# Patient Record
Sex: Female | Born: 1945 | Smoking: Never smoker
Health system: Southern US, Community
[De-identification: ages and names within clinical notes are randomized; demographics above are authoritative.]

## PROBLEM LIST (undated history)

## (undated) DIAGNOSIS — R011 Cardiac murmur, unspecified: Secondary | ICD-10-CM

## (undated) DIAGNOSIS — I10 Essential (primary) hypertension: Secondary | ICD-10-CM

## (undated) DIAGNOSIS — R600 Localized edema: Secondary | ICD-10-CM

## (undated) DIAGNOSIS — N390 Urinary tract infection, site not specified: Secondary | ICD-10-CM

## (undated) DIAGNOSIS — M1990 Unspecified osteoarthritis, unspecified site: Secondary | ICD-10-CM

## (undated) DIAGNOSIS — N189 Chronic kidney disease, unspecified: Secondary | ICD-10-CM

## (undated) DIAGNOSIS — R609 Edema, unspecified: Secondary | ICD-10-CM

## (undated) DIAGNOSIS — E119 Type 2 diabetes mellitus without complications: Secondary | ICD-10-CM

## (undated) HISTORY — PX: TONSILLECTOMY: SUR1361

## (undated) HISTORY — PX: HERNIA REPAIR: SHX51

## (undated) HISTORY — PX: TUBAL LIGATION: SHX77

---

## 1981-03-30 HISTORY — PX: BUNIONECTOMY: SHX129

## 1984-03-30 HISTORY — PX: BREAST SURGERY: SHX581

## 1989-03-30 HISTORY — PX: APPENDECTOMY: SHX54

## 1989-03-30 HISTORY — PX: ABDOMINAL HYSTERECTOMY: SHX81

## 1990-03-30 HISTORY — PX: CHOLECYSTECTOMY: SHX55

## 2007-03-31 HISTORY — PX: GASTRIC BYPASS: SHX52

## 2016-03-30 HISTORY — PX: PANNICULECTOMY: SUR1001

## 2018-03-30 DIAGNOSIS — D649 Anemia, unspecified: Secondary | ICD-10-CM

## 2018-03-30 HISTORY — DX: Anemia, unspecified: D64.9

## 2020-07-08 DIAGNOSIS — M1712 Unilateral primary osteoarthritis, left knee: Secondary | ICD-10-CM | POA: Diagnosis present

## 2020-07-10 ENCOUNTER — Encounter (HOSPITAL_COMMUNITY): Payer: Self-pay

## 2020-07-10 NOTE — Patient Instructions (Addendum)
DUE TO COVID-19 ONLY ONE VISITOR IS ALLOWED TO COME WITH YOU AND STAY IN THE WAITING ROOM ONLY DURING PRE OP AND PROCEDURE DAY OF SURGERY. THE 1 VISITOR  MAY VISIT WITH YOU AFTER SURGERY IN YOUR PRIVATE ROOM DURING VISITING HOURS ONLY!  YOU NEED TO HAVE A COVID 19 TEST ON___4/22____ @_2 :05 pm_____, THIS TEST MUST BE DONE BEFORE SURGERY,  COVID TESTING SITE [address] [place] , IT IS ON THE RIGHT GOING OUT [address] APPROXIMATELY  2 MINUTES PAST [employer] ON THE RIGHT. ONCE YOUR COVID TEST IS COMPLETED,  PLEASE BEGIN THE QUARANTINE INSTRUCTIONS AS OUTLINED IN YOUR HANDOUT.                Lori Navarro   Your procedure is scheduled on: 07/23/20   Report to [facility] Main  Entrance   Report to short stay at  5:15 AM     Call this number if you have problems the morning of surgery [phone]     BRUSH YOUR TEETH MORNING OF SURGERY AND RINSE YOUR MOUTH OUT, NO CHEWING GUM CANDY OR MINTS.  No food after midnight.    You may have clear liquid until 4:30 AM.     Nothing by mouth after 4:30 AM.    Take these medicines the morning of surgery with A SIP OF WATER: none  DO NOT TAKE ANY DIABETIC MEDICATIONS DAY OF YOUR SURGERY                               You may not have any metal on your body including hair pins and              piercings  Do not wear jewelry, make-up, lotions, powders or perfumes, deodorant             Do not wear nail polish on your fingernails.  Do not shave  48 hours prior to surgery.     Do not bring valuables to the hospital. [facility] IS NOT             RESPONSIBLE   FOR VALUABLES.  Contacts, dentures or bridgework may not be worn into surgery. .                  Please read over the following fact sheets you were given: _____________________________________________________________________             [facility] - Preparing for Surgery Before surgery, you can play an important role.  Because skin is  not sterile, your skin needs to be as free of germs as possible.  You can reduce the number of germs on your skin by washing with CHG (chlorahexidine gluconate) soap before surgery.  CHG is an antiseptic cleaner which kills germs and bonds with the skin to continue killing germs even after washing. Please DO NOT use if you have an allergy to CHG or antibacterial soaps.  If your skin becomes reddened/irritated stop using the CHG and inform your nurse when you arrive at Short Stay. Do not shave (including legs and underarms) for at least 48 hours prior to the first CHG shower.   Please follow these instructions carefully:  1.  Shower with CHG Soap the night before surgery and the  morning of Surgery.  2.  If you choose to wash your hair, wash your hair first as usual with your  normal  shampoo.  3.  After you shampoo, rinse your hair and body thoroughly to remove the  shampoo.                                        4.  Use CHG as you would any other liquid soap.  You can apply chg directly  to the skin and wash                       Gently with a scrungie or clean washcloth.  5.  Apply the CHG Soap to your body ONLY FROM THE NECK DOWN.   Do not use on face/ open                           Wound or open sores. Avoid contact with eyes, ears mouth and genitals (private parts).                       Wash face,  Genitals (private parts) with your normal soap.             6.  Wash thoroughly, paying special attention to the area where your surgery  will be performed.  7.  Thoroughly rinse your body with warm water from the neck down.  8.  DO NOT shower/wash with your normal soap after using and rinsing off  the CHG Soap.             9.  Pat yourself dry with a clean towel.            10.  Wear clean pajamas.            11.  Place clean sheets on your bed the night of your first shower and do not  sleep with pets. Day of Surgery : Do not apply any lotions/deodorants the morning of surgery.  Please wear  clean clothes to the hospital/surgery center.  FAILURE TO FOLLOW THESE INSTRUCTIONS MAY RESULT IN THE CANCELLATION OF YOUR SURGERY PATIENT SIGNATURE_________________________________  NURSE SIGNATURE__________________________________  ________________________________________________________________________   Lori Navarro  An incentive spirometer is a tool that can help keep your lungs clear and active. This tool measures how well you are filling your lungs with each breath. Taking long deep breaths may help reverse or decrease the chance of developing breathing (pulmonary) problems (especially infection) following:  A long period of time when you are unable to move or be active. BEFORE THE PROCEDURE   If the spirometer includes an indicator to show your best effort, your nurse or respiratory therapist will set it to a desired goal.  If possible, sit up straight or lean slightly forward. Try not to slouch.  Hold the incentive spirometer in an upright position. INSTRUCTIONS FOR USE  1. Sit on the edge of your bed if possible, or sit up as far as you can in bed or on a chair. 2. Hold the incentive spirometer in an upright position. 3. Breathe out normally. 4. Place the mouthpiece in your mouth and seal your lips tightly around it. 5. Breathe in slowly and as deeply as possible, raising the piston or the ball toward the top of the column. 6. Hold your breath for 3-5 seconds or for as long as possible. Allow the piston or ball to fall to the bottom of the column. 7. Remove the  mouthpiece from your mouth and breathe out normally. 8. Rest for a few seconds and repeat Steps 1 through 7 at least 10 times every 1-2 hours when you are awake. Take your time and take a few normal breaths between deep breaths. 9. The spirometer may include an indicator to show your best effort. Use the indicator as a goal to work toward during each repetition. 10. After each set of 10 deep breaths, practice  coughing to be sure your lungs are clear. If you have an incision (the cut made at the time of surgery), support your incision when coughing by placing a pillow or rolled up towels firmly against it. Once you are able to get out of bed, walk around indoors and cough well. You may stop using the incentive spirometer when instructed by your caregiver.  RISKS AND COMPLICATIONS  Take your time so you do not get dizzy or light-headed.  If you are in pain, you may need to take or ask for pain medication before doing incentive spirometry. It is harder to take a deep breath if you are having pain. AFTER USE  Rest and breathe slowly and easily.  It can be helpful to keep track of a log of your progress. Your caregiver can provide you with a simple table to help with this. If you are using the spirometer at home, follow these instructions: SEEK MEDICAL CARE IF:   You are having difficultly using the spirometer.  You have trouble using the spirometer as often as instructed.  Your pain medication is not giving enough relief while using the spirometer.  You develop fever of 100.5 F (38.1 C) or higher. SEEK IMMEDIATE MEDICAL CARE IF:   You cough up bloody sputum that had not been present before.  You develop fever of 102 F (38.9 C) or greater.  You develop worsening pain at or near the incision site. MAKE SURE YOU:   Understand these instructions.  Will watch your condition.  Will get help right away if you are not doing well or get worse. Document Released: 07/27/2006 Document Revised: 06/08/2011 Document Reviewed: 09/27/2006 [facility] Patient Information 2014 [place], [place].   ________________________________________________________________________

## 2020-07-11 ENCOUNTER — Encounter (HOSPITAL_COMMUNITY): Payer: Self-pay

## 2020-07-11 ENCOUNTER — Encounter (HOSPITAL_COMMUNITY)
Admission: RE | Admit: 2020-07-11 | Discharge: 2020-07-11 | Disposition: A | Discharge status: Home / Self Care | Payer: Medicare Other | Source: Ambulatory Visit | Attending: Orthopedic Surgery | Admitting: Orthopedic Surgery

## 2020-07-11 ENCOUNTER — Other Ambulatory Visit: Payer: Self-pay

## 2020-07-11 DIAGNOSIS — Z01818 Encounter for other preprocedural examination: Secondary | ICD-10-CM | POA: Insufficient documentation

## 2020-07-11 HISTORY — DX: Type 2 diabetes mellitus without complications: E11.9

## 2020-07-11 HISTORY — DX: Urinary tract infection, site not specified: N39.0

## 2020-07-11 HISTORY — DX: Essential (primary) hypertension: I10

## 2020-07-11 HISTORY — DX: Chronic kidney disease, unspecified: N18.9

## 2020-07-11 HISTORY — DX: Unspecified osteoarthritis, unspecified site: M19.90

## 2020-07-11 HISTORY — DX: Cardiac murmur, unspecified: R01.1

## 2020-07-11 HISTORY — DX: Edema, unspecified: R60.9

## 2020-07-11 HISTORY — DX: Localized edema: R60.0

## 2020-07-11 LAB — CBC
HCT: 39 % (ref 36.0–46.0)
Hemoglobin: 12.7 g/dL (ref 12.0–15.0)
MCH: 31.1 pg (ref 26.0–34.0)
MCHC: 32.6 g/dL (ref 30.0–36.0)
MCV: 95.4 fL (ref 80.0–100.0)
Platelets: 224 10*3/uL (ref 150–400)
RBC: 4.09 MIL/uL (ref 3.87–5.11)
RDW: 13.2 % (ref 11.5–15.5)
WBC: 5.1 10*3/uL (ref 4.0–10.5)
nRBC: 0 % (ref 0.0–0.2)

## 2020-07-11 LAB — SURGICAL PCR SCREEN
MRSA, PCR: NEGATIVE
Staphylococcus aureus: NEGATIVE

## 2020-07-11 LAB — BASIC METABOLIC PANEL
Anion gap: 10 (ref 5–15)
BUN: 14 mg/dL (ref 8–23)
CO2: 27 mmol/L (ref 22–32)
Calcium: 9.2 mg/dL (ref 8.9–10.3)
Chloride: 102 mmol/L (ref 98–111)
Creatinine, Ser: 0.39 mg/dL — ABNORMAL LOW (ref 0.44–1.00)
GFR, Estimated: >60 mL/min (ref >60)
Glucose, Bld: 120 mg/dL — ABNORMAL HIGH (ref 70–99)
Potassium: 3.5 mmol/L (ref 3.5–5.1)
Sodium: 139 mmol/L (ref 135–145)

## 2020-07-11 LAB — HEMOGLOBIN A1C
Hgb A1c MFr Bld: 5.6 % (ref 4.8–5.6)
Mean Plasma Glucose: 114.02 mg/dL

## 2020-07-11 LAB — GLUCOSE, CAPILLARY: Glucose-Capillary: 92 mg/dL (ref 70–99)

## 2020-07-11 NOTE — Progress Notes (Signed)
COVID Vaccine Completed:No Date COVID Vaccine completed: COVID vaccine manufacturer: Pfizer    [facility] [employer]   PCP - Dr. Juliene Pina Cardiologist -referred by Dr. Carolin Sicks. To a Dr with Lucienne Minks  on [address] for ECHO.   Chest x-ray - no EKG - 07/11/20-chart,epic Stress Test - no ECHO -  Cardiac Cath - no Pacemaker/ICD device last checked:NA  Sleep Study no-  CPAP -   Fasting Blood Sugar - Pt doesn't test. She takes Metformin daily but considers herself "pre diabetic" Checks Blood Sugar _____ times a day  Blood Thinner Instructions:NA Aspirin Instructions: Last Dose:  Anesthesia review:   Patient denies shortness of breath, fever, cough and chest pain at PAT appointment  yes Patient verbalized understanding of instructions that were given to them at the PAT appointment. Patient was also instructed that they will need to review over the PAT instructions again at home before surgery.Yes  Pt reports no SOB with any activities.

## 2020-07-18 ENCOUNTER — Encounter (HOSPITAL_COMMUNITY): Payer: Self-pay

## 2020-07-18 NOTE — Progress Notes (Signed)
Anesthesia Chart Review:   Case: [id number] Date/Time: 07/23/20 0715   Procedure: TOTAL KNEE ARTHROPLASTY (Left Knee)   Anesthesia type: Choice   Pre-op diagnosis: degenerative joint disease  left knee   Location: [facility] ROOM 07 / [facility]   Surgeons: Teryl Lucy, MD      DISCUSSION: Pt is 75 years old with hx HTN, DM, heart murmur (mild mitral regurg on 01/2019 echo), anemia   VS: BP 140/80   Pulse 78   Temp 36.7 C (Oral)   Resp 20   Ht 5\' 3"  (1.6 m)   Wt 97.5 kg   SpO2 97%   BMI 38.09 kg/m    PROVIDERS: - PCP is , PA. Last office visit 02/27/20   LABS: Labs reviewed: Acceptable for surgery. (all labs ordered are listed, but only abnormal results are displayed)  Labs Reviewed  BASIC METABOLIC PANEL - Abnormal; Notable for the following components:      Result Value   Glucose, Bld 120 (*)    Creatinine, Ser 0.39 (*)    All other components within normal limits  SURGICAL PCR SCREEN  CBC  HEMOGLOBIN A1C  GLUCOSE, CAPILLARY    EKG 07/11/20: NSR   CV: Echo 02/09/19 (done for murmur): 1. LV normal in size with mildly increased wall thickness 2. Normal LV systolic function, EF >55% 3. Normal RV size and systolic function 4. Dilated LA - mildly dilated 5. Mild mitral regurgitation 6. Mild tricuspid regurgitation 7. Mild pulmonic regurgitation   Past Medical History:  Diagnosis Date  . Anemia 2020   took iron  . Arthritis    knee  . Chronic UTI   . Diabetes mellitus without complication (HCC)    pt reports pre but takes metformin  . Heart murmur   . Hypertension   . Peripheral edema    mild    Past Surgical History:  Procedure Laterality Date  . ABDOMINAL HYSTERECTOMY  1991  . APPENDECTOMY  1991  . BREAST SURGERY  1986   reduction  . BUNIONECTOMY Bilateral 1983  . CHOLECYSTECTOMY  1992  . GASTRIC BYPASS  2009  . HERNIA REPAIR    . PANNICULECTOMY  2018  . TONSILLECTOMY     as child  . TUBAL LIGATION      MEDICATIONS: .  Carboxymethylcellulose Sodium (THERATEARS) 0.25 % SOLN  . Cholecalciferol (VITAMIN D3) 250 MCG (10000 UT) TABS  . Cholecalciferol (VITAMIN D3) 75 MCG (3000 UT) TABS  . diphenhydrAMINE (BENADRYL) 25 MG tablet  . loratadine (CLARITIN) 10 MG tablet  . losartan-hydrochlorothiazide (HYZAAR) 100-25 MG tablet  . metFORMIN (GLUCOPHAGE) 500 MG tablet  . Nutritional Supplements (DHEA PO)  . sulfamethoxazole-trimethoprim (BACTRIM) 400-80 MG tablet   No current facility-administered medications for this encounter.    If no changes, I anticipate pt can proceed with surgery as scheduled.   2019, PhD, FNP-BC [facility] Short Stay Surgical Center/Anesthesiology Phone: [phone] 07/18/2020 9:57 AM

## 2020-07-18 NOTE — Anesthesia Preprocedure Evaluation (Addendum)
Anesthesia Evaluation  Patient identified by MRN, date of birth, ID band Patient awake    Reviewed: Allergy & Precautions, NPO status , Patient's Chart, lab work & pertinent test results  Airway Mallampati: IV  TM Distance: >3 FB Neck ROM: Full    Dental no notable dental hx.    Pulmonary neg pulmonary ROS,    Pulmonary exam normal breath sounds clear to auscultation       Cardiovascular hypertension, Pt. on medications Normal cardiovascular exam Rhythm:Regular Rate:Normal  ECG: NSR, rate 69   Neuro/Psych negative neurological ROS  negative psych ROS   GI/Hepatic negative GI ROS, Neg liver ROS,   Endo/Other  diabetes, Oral Hypoglycemic Agents  Renal/GU negative Renal ROS     Musculoskeletal  (+) Arthritis ,   Abdominal   Peds  Hematology negative hematology ROS (+)   Anesthesia Other Findings degenerative joint disease left knee  Reproductive/Obstetrics                            Anesthesia Physical Anesthesia Plan  ASA: III  Anesthesia Plan: Spinal and Regional   Post-op Pain Management:  Regional for Post-op pain   Induction:   PONV Risk Score and Plan: 2 and Ondansetron, Dexamethasone, Propofol infusion and Treatment may vary due to age or medical condition  Airway Management Planned: Simple Face Mask  Additional Equipment:   Intra-op Plan:   Post-operative Plan:   Informed Consent: I have reviewed the patients History and Physical, chart, labs and discussed the procedure including the risks, benefits and alternatives for the proposed anesthesia with the patient or authorized representative who has indicated his/her understanding and acceptance.     Dental advisory given  Plan Discussed with: CRNA  Anesthesia Plan Comments: (Reviewed  APP note by Joslyn Hy, FNP )       Anesthesia Quick Evaluation

## 2020-07-19 ENCOUNTER — Other Ambulatory Visit (HOSPITAL_COMMUNITY)
Admission: RE | Admit: 2020-07-19 | Discharge: 2020-07-19 | Disposition: A | Discharge status: Home / Self Care | Payer: Medicare Other | Source: Ambulatory Visit | Attending: Orthopedic Surgery | Admitting: Orthopedic Surgery

## 2020-07-19 DIAGNOSIS — Z20822 Contact with and (suspected) exposure to covid-19: Secondary | ICD-10-CM | POA: Insufficient documentation

## 2020-07-19 DIAGNOSIS — Z01812 Encounter for preprocedural laboratory examination: Secondary | ICD-10-CM | POA: Diagnosis present

## 2020-07-20 LAB — SARS CORONAVIRUS 2 (TAT 6-24 HRS): SARS Coronavirus 2: NEGATIVE

## 2020-07-22 NOTE — H&P (Addendum)
KNEE ARTHROPLASTY ADMISSION H&P  Patient ID: Lori Navarro MRN: [id number] DOB/AGE: Jul 13, 1945 75 y.o.  Chief Complaint: left knee pain.  Planned Procedure Date: 07/23/20 Medical Clearance by Juliene Pina, PA-C     HPI: Lori Navarro is a 75 y.o. female who presents for evaluation of degenerative joint disease  left knee. The patient has a history of pain and functional disability in the left knee due to arthritis and has failed non-surgical conservative treatments for greater than 12 weeks to include NSAID's and/or analgesics, weight reduction as appropriate and activity modification.  Onset of symptoms was gradual, starting 2 years ago with rapidlly worsening course since that time. The patient noted no past surgery on the left knee.  Patient currently rates pain at 4 out of 10 with activity. Patient has worsening of pain with activity and weight bearing and pain that interferes with activities of daily living.  Patient has evidence of joint space narrowing by imaging studies.  There is no active infection.  Past Medical History:  Diagnosis Date  . Anemia 2020   took iron  . Arthritis    knee  . Chronic UTI   . Diabetes mellitus without complication (HCC)    pt reports pre but takes metformin  . Heart murmur   . Hypertension   . Peripheral edema    mild   Past Surgical History:  Procedure Laterality Date  . ABDOMINAL HYSTERECTOMY  1991  . APPENDECTOMY  1991  . BREAST SURGERY  1986   reduction  . BUNIONECTOMY Bilateral 1983  . CHOLECYSTECTOMY  1992  . GASTRIC BYPASS  2009  . HERNIA REPAIR    . PANNICULECTOMY  2018  . TONSILLECTOMY     as child  . TUBAL LIGATION     Allergies  Allergen Reactions  . Erythromycin     Dry Heaves  . Keflex [Cephalexin]     Dry heaves  . Oxycodone Hcl     Hallucinations   Prior to Admission medications   Medication Sig Start Date End Date Taking? Authorizing Provider  Carboxymethylcellulose Sodium (THERATEARS) 0.25 % SOLN Place 1 drop  into both ears daily as needed (Dry eye).   Yes [provider]  Cholecalciferol (VITAMIN D3) 250 MCG (10000 UT) TABS Take 10,000 mcg by mouth daily.   Yes [provider]  diphenhydrAMINE (BENADRYL) 25 MG tablet Take 25 mg by mouth every 6 (six) hours as needed for allergies.   Yes [provider]  loratadine (CLARITIN) 10 MG tablet Take 10 mg by mouth daily as needed for allergies.   Yes [provider]  losartan-hydrochlorothiazide (HYZAAR) 100-25 MG tablet Take 1 tablet by mouth daily.   Yes [provider]  metFORMIN (GLUCOPHAGE) 500 MG tablet Take 500 mg by mouth 2 (two) times daily with a meal.   Yes [provider]  Nutritional Supplements (DHEA PO) Take 1 tablet by mouth daily.   Yes [provider]  sulfamethoxazole-trimethoprim (BACTRIM) 400-80 MG tablet Take 1 tablet by mouth daily.   Yes [provider]  Cholecalciferol (VITAMIN D3) 75 MCG (3000 UT) TABS Take 3,000 Units by mouth daily.    [provider]   Social History   Socioeconomic History  . Marital status: Unknown    Spouse name: Not on file  . Number of children: Not on file  . Years of education: Not on file  . Highest education level: Not on file  Occupational History  . Not on file  Tobacco Use  .  Smoking status: Never Smoker  . Smokeless tobacco: Never Used  Vaping Use  . Vaping Use: Never used  Substance and Sexual Activity  . Alcohol use: Yes    Comment: one every 2 weeks  . Drug use: Never  . Sexual activity: Not on file  Other Topics Concern  . Not on file  Social History Narrative  . Not on file   Social Determinants of Health   Financial Resource Strain: Not on file  Food Insecurity: Not on file  Transportation Needs: Not on file  Physical Activity: Not on file  Stress: Not on file  Social Connections: Not on file   No family history on file.  ROS: Currently denies lightheadedness, dizziness, Fever, chills,  CP, SOB.   No personal history of DVT, PE, MI, or CVA. No loose teeth or dentures All other systems have been reviewed and were otherwise currently negative with the exception of those mentioned in the HPI and as above.  Objective: Vitals: Ht: 5\' 3"  Wt: 214 lbs Temp: 97.7 BP: 119/76 Pulse: 74 O2 99% on room air.   Physical Exam: General: Alert, NAD.   HEENT: EOMI, Good Neck Extension  Pulm: No increased work of breathing.  Clear B/L A/P w/o crackle or wheeze.  CV: RRR, No m/g/r appreciated  GI: soft, NT, ND Neuro: Neuro without gross focal deficit.  Sensation intact distally Skin: No lesions in the area of chief complaint MSK/Surgical Site: left knee w/o redness or effusion.  lateral JLT. ROM 5/85.  5/5 strength in extension and flexion.  +EHL/FHL.  NVI.  Stable varus and valgus stress.    Imaging Review Plain radiographs demonstrate severe degenerative joint disease of the left knee.   The overall alignment issignificant valgus. The bone quality appears to be adequate for age and reported activity level.  Preoperative templating of the joint replacement has been completed, documented, and submitted to the Operating Room personnel in order to optimize intra-operative equipment management.  Assessment: Active Problems:   Osteoarthritis of left knee   Plan: Plan for Procedure(s): TOTAL KNEE ARTHROPLASTY  The patient history, physical exam, clinical judgement of the provider and imaging are consistent with end stage degenerative joint disease and total joint arthroplasty is deemed medically necessary. The treatment options including medical management, injection therapy, and arthroplasty were discussed at length. The risks and benefits of Procedure(s): TOTAL KNEE ARTHROPLASTY were presented and reviewed.  The risks of nonoperative treatment, versus surgical intervention including but not limited to continued pain, aseptic loosening, stiffness, dislocation/subluxation, infection,  bleeding, nerve injury, blood clots, cardiopulmonary complications, morbidity, mortality, among others were discussed. The patient verbalizes understanding and wishes to proceed with the plan.  Patient is being admitted for inpatient treatment for surgery, pain control, PT, prophylactic antibiotics, VTE prophylaxis, progressive ambulation, ADL's and discharge planning.   Dental prophylaxis discussed and recommended for 2 years postoperatively.   The patient does  meet the criteria for TXA which will be used perioperatively.    ASA 325 mg will be used postoperatively for DVT prophylaxis in addition to SCDs, and early ambulation  The patient is planning to be discharged home with HHPT ([facility]) in care of her husband    6/85, Armida Sans 07/22/2020 1:04 PM

## 2020-07-23 ENCOUNTER — Inpatient Hospital Stay (HOSPITAL_COMMUNITY)
Admission: RE | Admit: 2020-07-23 | Discharge: 2020-07-25 | DRG: 470 | Disposition: A | Discharge status: Home Health | Payer: Medicare Other | Attending: Orthopedic Surgery | Admitting: Orthopedic Surgery

## 2020-07-23 ENCOUNTER — Encounter (HOSPITAL_COMMUNITY): Payer: Self-pay | Admitting: Orthopedic Surgery

## 2020-07-23 ENCOUNTER — Ambulatory Visit (HOSPITAL_COMMUNITY): Payer: Medicare Other | Admitting: Certified Registered"

## 2020-07-23 ENCOUNTER — Other Ambulatory Visit: Payer: Self-pay

## 2020-07-23 ENCOUNTER — Observation Stay (HOSPITAL_COMMUNITY): Payer: Medicare Other

## 2020-07-23 ENCOUNTER — Ambulatory Visit (HOSPITAL_COMMUNITY): Payer: Medicare Other | Admitting: Emergency Medicine

## 2020-07-23 ENCOUNTER — Encounter (HOSPITAL_COMMUNITY)
Admission: RE | Disposition: A | Discharge status: Home Health | Payer: Self-pay | Source: Home / Self Care | Attending: Orthopedic Surgery

## 2020-07-23 DIAGNOSIS — Z79899 Other long term (current) drug therapy: Secondary | ICD-10-CM

## 2020-07-23 DIAGNOSIS — M21062 Valgus deformity, not elsewhere classified, left knee: Secondary | ICD-10-CM | POA: Diagnosis present

## 2020-07-23 DIAGNOSIS — M1712 Unilateral primary osteoarthritis, left knee: Principal | ICD-10-CM | POA: Diagnosis present

## 2020-07-23 DIAGNOSIS — I10 Essential (primary) hypertension: Secondary | ICD-10-CM | POA: Diagnosis present

## 2020-07-23 DIAGNOSIS — R52 Pain, unspecified: Secondary | ICD-10-CM | POA: Diagnosis present

## 2020-07-23 DIAGNOSIS — Z96652 Presence of left artificial knee joint: Secondary | ICD-10-CM

## 2020-07-23 DIAGNOSIS — M25762 Osteophyte, left knee: Secondary | ICD-10-CM | POA: Diagnosis present

## 2020-07-23 DIAGNOSIS — Z90710 Acquired absence of both cervix and uterus: Secondary | ICD-10-CM

## 2020-07-23 DIAGNOSIS — Z87440 Personal history of urinary (tract) infections: Secondary | ICD-10-CM

## 2020-07-23 DIAGNOSIS — Z9884 Bariatric surgery status: Secondary | ICD-10-CM

## 2020-07-23 DIAGNOSIS — Z9851 Tubal ligation status: Secondary | ICD-10-CM

## 2020-07-23 DIAGNOSIS — Z7984 Long term (current) use of oral hypoglycemic drugs: Secondary | ICD-10-CM

## 2020-07-23 DIAGNOSIS — E119 Type 2 diabetes mellitus without complications: Secondary | ICD-10-CM | POA: Diagnosis present

## 2020-07-23 HISTORY — PX: TOTAL KNEE ARTHROPLASTY: SHX125

## 2020-07-23 LAB — GLUCOSE, CAPILLARY
Glucose-Capillary: 90 mg/dL (ref 70–99)
Glucose-Capillary: 97 mg/dL (ref 70–99)

## 2020-07-23 SURGERY — ARTHROPLASTY, KNEE, TOTAL
Anesthesia: Regional | Site: Knee | Laterality: Left

## 2020-07-23 MED ORDER — 0.9 % SODIUM CHLORIDE (POUR BTL) OPTIME
TOPICAL | Status: DC | PRN
  Administered 2020-07-23: 1000 mL

## 2020-07-23 MED ORDER — FENTANYL CITRATE (PF) 100 MCG/2ML IJ SOLN
INTRAMUSCULAR | Status: AC
  Administered 2020-07-23: 50 ug via INTRAVENOUS
  Filled 2020-07-23: qty 2

## 2020-07-23 MED ORDER — ONDANSETRON HCL 4 MG PO TABS: 4.0000 mg | ORAL_TABLET | Freq: Three times a day (TID) | ORAL | 0 refills | Status: AC | PRN

## 2020-07-23 MED ORDER — METHOCARBAMOL 500 MG IVPB - SIMPLE MED
INTRAVENOUS | Status: AC
  Administered 2020-07-23: 500 mg via INTRAVENOUS
  Filled 2020-07-23: qty 50

## 2020-07-23 MED ORDER — CARBOXYMETHYLCELLULOSE SODIUM 0.25 % OP SOLN: 1.0000 [drp] | Freq: Every day | OPHTHALMIC | Status: DC | PRN

## 2020-07-23 MED ORDER — ASPIRIN EC 325 MG PO TBEC
325.0000 mg | DELAYED_RELEASE_TABLET | Freq: Every day | ORAL | Status: DC
  Administered 2020-07-24 – 2020-07-25 (×2): 325 mg via ORAL
  Filled 2020-07-23 (×2): qty 1

## 2020-07-23 MED ORDER — ACETAMINOPHEN 500 MG PO TABS
1000.0000 mg | ORAL_TABLET | Freq: Once | ORAL | Status: AC
  Administered 2020-07-23: 1000 mg via ORAL
  Filled 2020-07-23: qty 2

## 2020-07-23 MED ORDER — PROPOFOL 10 MG/ML IV BOLUS
INTRAVENOUS | Status: AC
  Filled 2020-07-23: qty 20

## 2020-07-23 MED ORDER — SODIUM CHLORIDE 0.9 % IR SOLN
Status: DC | PRN
  Administered 2020-07-23: 1000 mL

## 2020-07-23 MED ORDER — ACETAMINOPHEN 325 MG PO TABS: 325.0000 mg | ORAL_TABLET | Freq: Four times a day (QID) | ORAL | Status: DC | PRN

## 2020-07-23 MED ORDER — ASPIRIN EC 325 MG PO TBEC: 325.0000 mg | DELAYED_RELEASE_TABLET | Freq: Two times a day (BID) | ORAL | 0 refills | Status: AC

## 2020-07-23 MED ORDER — KETOROLAC TROMETHAMINE 30 MG/ML IJ SOLN
INTRAMUSCULAR | Status: DC | PRN
  Administered 2020-07-23: 30 mg

## 2020-07-23 MED ORDER — PHENOL 1.4 % MT LIQD: 1.0000 | OROMUCOSAL | Status: DC | PRN

## 2020-07-23 MED ORDER — FENTANYL CITRATE (PF) 100 MCG/2ML IJ SOLN
INTRAMUSCULAR | Status: DC | PRN
  Administered 2020-07-23 (×2): 50 ug via INTRAVENOUS

## 2020-07-23 MED ORDER — METHOCARBAMOL 500 MG PO TABS
500.0000 mg | ORAL_TABLET | Freq: Four times a day (QID) | ORAL | Status: DC | PRN
  Administered 2020-07-23 – 2020-07-25 (×8): 500 mg via ORAL
  Filled 2020-07-23 (×8): qty 1

## 2020-07-23 MED ORDER — KETOROLAC TROMETHAMINE 30 MG/ML IJ SOLN
INTRAMUSCULAR | Status: AC
  Filled 2020-07-23: qty 1

## 2020-07-23 MED ORDER — ONDANSETRON HCL 4 MG PO TABS: 4.0000 mg | ORAL_TABLET | Freq: Four times a day (QID) | ORAL | Status: DC | PRN

## 2020-07-23 MED ORDER — ONDANSETRON HCL 4 MG/2ML IJ SOLN: 4.0000 mg | Freq: Once | INTRAMUSCULAR | Status: DC | PRN

## 2020-07-23 MED ORDER — POVIDONE-IODINE 10 % EX SWAB
2.0000 "application " | Freq: Once | CUTANEOUS | Status: AC
  Administered 2020-07-23: 2 via TOPICAL

## 2020-07-23 MED ORDER — ROPIVACAINE HCL 5 MG/ML IJ SOLN
INTRAMUSCULAR | Status: DC | PRN
  Administered 2020-07-23: 30 mL via PERINEURAL

## 2020-07-23 MED ORDER — FENTANYL CITRATE (PF) 250 MCG/5ML IJ SOLN
INTRAMUSCULAR | Status: AC
  Filled 2020-07-23: qty 5

## 2020-07-23 MED ORDER — BUPIVACAINE-EPINEPHRINE (PF) 0.25% -1:200000 IJ SOLN
INTRAMUSCULAR | Status: AC
  Filled 2020-07-23: qty 30

## 2020-07-23 MED ORDER — VANCOMYCIN HCL 1000 MG/200ML IV SOLN
1000.0000 mg | Freq: Two times a day (BID) | INTRAVENOUS | Status: AC
  Administered 2020-07-23: 1000 mg via INTRAVENOUS
  Filled 2020-07-23: qty 200

## 2020-07-23 MED ORDER — AMISULPRIDE (ANTIEMETIC) 5 MG/2ML IV SOLN: 10.0000 mg | Freq: Once | INTRAVENOUS | Status: DC | PRN

## 2020-07-23 MED ORDER — METHOCARBAMOL 500 MG IVPB - SIMPLE MED
500.0000 mg | Freq: Four times a day (QID) | INTRAVENOUS | Status: DC | PRN
  Filled 2020-07-23: qty 50

## 2020-07-23 MED ORDER — HYDROCODONE-ACETAMINOPHEN 10-325 MG PO TABS: 1.0000 | ORAL_TABLET | Freq: Four times a day (QID) | ORAL | 0 refills | Status: AC | PRN

## 2020-07-23 MED ORDER — DIPHENHYDRAMINE HCL 25 MG PO CAPS: 25.0000 mg | ORAL_CAPSULE | Freq: Four times a day (QID) | ORAL | Status: DC | PRN

## 2020-07-23 MED ORDER — METOCLOPRAMIDE HCL 5 MG PO TABS: 5.0000 mg | ORAL_TABLET | Freq: Three times a day (TID) | ORAL | Status: DC | PRN

## 2020-07-23 MED ORDER — PROPOFOL 500 MG/50ML IV EMUL
INTRAVENOUS | Status: DC | PRN
  Administered 2020-07-23: 70 ug/kg/min via INTRAVENOUS

## 2020-07-23 MED ORDER — TRANEXAMIC ACID-NACL 1000-0.7 MG/100ML-% IV SOLN
1000.0000 mg | INTRAVENOUS | Status: AC
  Administered 2020-07-23: 1000 mg via INTRAVENOUS
  Filled 2020-07-23: qty 100

## 2020-07-23 MED ORDER — ONDANSETRON HCL 4 MG/2ML IJ SOLN
INTRAMUSCULAR | Status: AC
  Filled 2020-07-23: qty 2

## 2020-07-23 MED ORDER — ORAL CARE MOUTH RINSE: 15.0000 mL | Freq: Once | OROMUCOSAL | Status: AC

## 2020-07-23 MED ORDER — MENTHOL 3 MG MT LOZG: 1.0000 | LOZENGE | OROMUCOSAL | Status: DC | PRN

## 2020-07-23 MED ORDER — LOSARTAN POTASSIUM-HCTZ 100-25 MG PO TABS: 1.0000 | ORAL_TABLET | Freq: Every day | ORAL | Status: DC

## 2020-07-23 MED ORDER — METOCLOPRAMIDE HCL 5 MG/ML IJ SOLN: 5.0000 mg | Freq: Three times a day (TID) | INTRAMUSCULAR | Status: DC | PRN

## 2020-07-23 MED ORDER — HYDROCODONE-ACETAMINOPHEN 5-325 MG PO TABS
1.0000 | ORAL_TABLET | ORAL | Status: DC | PRN
  Filled 2020-07-23: qty 1

## 2020-07-23 MED ORDER — WATER FOR IRRIGATION, STERILE IR SOLN
Status: DC | PRN
  Administered 2020-07-23: 2000 mL

## 2020-07-23 MED ORDER — MORPHINE SULFATE (PF) 2 MG/ML IV SOLN
0.5000 mg | INTRAVENOUS | Status: DC | PRN
  Administered 2020-07-23 – 2020-07-24 (×3): 1 mg via INTRAVENOUS
  Filled 2020-07-23 (×3): qty 1

## 2020-07-23 MED ORDER — ONDANSETRON HCL 4 MG/2ML IJ SOLN
INTRAMUSCULAR | Status: DC | PRN
  Administered 2020-07-23: 4 mg via INTRAVENOUS

## 2020-07-23 MED ORDER — BACLOFEN 10 MG PO TABS: 10.0000 mg | ORAL_TABLET | Freq: Three times a day (TID) | ORAL | 0 refills | Status: AC

## 2020-07-23 MED ORDER — HYDROCHLOROTHIAZIDE 25 MG PO TABS
25.0000 mg | ORAL_TABLET | Freq: Every day | ORAL | Status: DC
  Administered 2020-07-23 – 2020-07-25 (×3): 25 mg via ORAL
  Filled 2020-07-23 (×3): qty 1

## 2020-07-23 MED ORDER — LOSARTAN POTASSIUM 50 MG PO TABS
100.0000 mg | ORAL_TABLET | Freq: Every day | ORAL | Status: DC
  Administered 2020-07-24 – 2020-07-25 (×2): 100 mg via ORAL
  Filled 2020-07-23 (×3): qty 2

## 2020-07-23 MED ORDER — POLYETHYLENE GLYCOL 3350 17 G PO PACK: 17.0000 g | PACK | Freq: Every day | ORAL | Status: DC | PRN

## 2020-07-23 MED ORDER — METFORMIN HCL 500 MG PO TABS
500.0000 mg | ORAL_TABLET | Freq: Two times a day (BID) | ORAL | Status: DC
  Filled 2020-07-23 (×2): qty 1

## 2020-07-23 MED ORDER — SENNA-DOCUSATE SODIUM 8.6-50 MG PO TABS: 2.0000 | ORAL_TABLET | Freq: Every day | ORAL | 1 refills | Status: AC

## 2020-07-23 MED ORDER — LACTATED RINGERS IV SOLN: INTRAVENOUS | Status: DC

## 2020-07-23 MED ORDER — ONDANSETRON HCL 4 MG/2ML IJ SOLN: 4.0000 mg | Freq: Four times a day (QID) | INTRAMUSCULAR | Status: DC | PRN

## 2020-07-23 MED ORDER — DOCUSATE SODIUM 100 MG PO CAPS
100.0000 mg | ORAL_CAPSULE | Freq: Two times a day (BID) | ORAL | Status: DC
  Administered 2020-07-23 – 2020-07-25 (×4): 100 mg via ORAL
  Filled 2020-07-23 (×4): qty 1

## 2020-07-23 MED ORDER — POTASSIUM CHLORIDE IN NACL 20-0.45 MEQ/L-% IV SOLN
INTRAVENOUS | Status: DC
  Filled 2020-07-23 (×3): qty 1000

## 2020-07-23 MED ORDER — MAGNESIUM CITRATE PO SOLN: 1.0000 | Freq: Once | ORAL | Status: DC | PRN

## 2020-07-23 MED ORDER — VANCOMYCIN HCL 1000 MG IV SOLR
INTRAVENOUS | Status: DC | PRN
  Administered 2020-07-23: 1000 mg via INTRAVENOUS

## 2020-07-23 MED ORDER — LIDOCAINE 2% (20 MG/ML) 5 ML SYRINGE
INTRAMUSCULAR | Status: AC
  Filled 2020-07-23: qty 5

## 2020-07-23 MED ORDER — TRANEXAMIC ACID-NACL 1000-0.7 MG/100ML-% IV SOLN
1000.0000 mg | Freq: Once | INTRAVENOUS | Status: AC
  Administered 2020-07-23: 1000 mg via INTRAVENOUS
  Filled 2020-07-23: qty 100

## 2020-07-23 MED ORDER — PROPOFOL 10 MG/ML IV BOLUS
INTRAVENOUS | Status: DC | PRN
  Administered 2020-07-23 (×2): 20 mg via INTRAVENOUS
  Administered 2020-07-23: 10 mg via INTRAVENOUS
  Administered 2020-07-23: 50 mg via INTRAVENOUS

## 2020-07-23 MED ORDER — FENTANYL CITRATE (PF) 100 MCG/2ML IJ SOLN
25.0000 ug | INTRAMUSCULAR | Status: DC | PRN
  Administered 2020-07-23: 50 ug via INTRAVENOUS

## 2020-07-23 MED ORDER — MIDAZOLAM HCL 2 MG/2ML IJ SOLN
INTRAMUSCULAR | Status: DC | PRN
  Administered 2020-07-23 (×2): 1 mg via INTRAVENOUS

## 2020-07-23 MED ORDER — CHLORHEXIDINE GLUCONATE 0.12 % MT SOLN
15.0000 mL | Freq: Once | OROMUCOSAL | Status: AC
  Administered 2020-07-23: 15 mL via OROMUCOSAL

## 2020-07-23 MED ORDER — DEXAMETHASONE SODIUM PHOSPHATE 10 MG/ML IJ SOLN
INTRAMUSCULAR | Status: AC
  Filled 2020-07-23: qty 1

## 2020-07-23 MED ORDER — BUPIVACAINE IN DEXTROSE 0.75-8.25 % IT SOLN
INTRATHECAL | Status: DC | PRN
  Administered 2020-07-23: 1.6 mL via INTRATHECAL

## 2020-07-23 MED ORDER — ALUM & MAG HYDROXIDE-SIMETH 200-200-20 MG/5ML PO SUSP
30.0000 mL | ORAL | Status: DC | PRN
  Administered 2020-07-24: 30 mL via ORAL
  Filled 2020-07-23 (×2): qty 30

## 2020-07-23 MED ORDER — PHENYLEPHRINE HCL-NACL 10-0.9 MG/250ML-% IV SOLN
INTRAVENOUS | Status: AC
  Filled 2020-07-23: qty 500

## 2020-07-23 MED ORDER — DEXAMETHASONE SODIUM PHOSPHATE 10 MG/ML IJ SOLN
10.0000 mg | Freq: Once | INTRAMUSCULAR | Status: DC
  Filled 2020-07-23: qty 1

## 2020-07-23 MED ORDER — HYDROCODONE-ACETAMINOPHEN 7.5-325 MG PO TABS
1.0000 | ORAL_TABLET | ORAL | Status: DC | PRN
  Administered 2020-07-23: 1 via ORAL
  Administered 2020-07-23: 2 via ORAL
  Administered 2020-07-23: 1 via ORAL
  Administered 2020-07-24 – 2020-07-25 (×7): 2 via ORAL
  Filled 2020-07-23 (×4): qty 2
  Filled 2020-07-23: qty 1
  Filled 2020-07-23: qty 2
  Filled 2020-07-23: qty 1
  Filled 2020-07-23 (×5): qty 2

## 2020-07-23 MED ORDER — BISACODYL 10 MG RE SUPP: 10.0000 mg | Freq: Every day | RECTAL | Status: DC | PRN

## 2020-07-23 MED ORDER — BUPIVACAINE HCL 0.25 % IJ SOLN
INTRAMUSCULAR | Status: DC | PRN
  Administered 2020-07-23: 30 mL

## 2020-07-23 MED ORDER — VANCOMYCIN HCL IN DEXTROSE 1-5 GM/200ML-% IV SOLN
1000.0000 mg | INTRAVENOUS | Status: AC
  Administered 2020-07-23: 1000 mg via INTRAVENOUS
  Filled 2020-07-23: qty 200

## 2020-07-23 MED ORDER — FENTANYL CITRATE (PF) 100 MCG/2ML IJ SOLN
INTRAMUSCULAR | Status: AC
  Filled 2020-07-23: qty 2

## 2020-07-23 MED ORDER — LORATADINE 10 MG PO TABS: 10.0000 mg | ORAL_TABLET | Freq: Every day | ORAL | Status: DC | PRN

## 2020-07-23 MED ORDER — MIDAZOLAM HCL 2 MG/2ML IJ SOLN
INTRAMUSCULAR | Status: AC
  Filled 2020-07-23: qty 2

## 2020-07-23 MED ORDER — POLYVINYL ALCOHOL 1.4 % OP SOLN: 1.0000 [drp] | OPHTHALMIC | Status: DC | PRN

## 2020-07-23 MED ORDER — ACETAMINOPHEN 500 MG PO TABS
500.0000 mg | ORAL_TABLET | Freq: Four times a day (QID) | ORAL | Status: AC
  Administered 2020-07-23 (×2): 500 mg via ORAL
  Filled 2020-07-23 (×2): qty 1

## 2020-07-23 MED ORDER — SULFAMETHOXAZOLE-TRIMETHOPRIM 400-80 MG PO TABS
1.0000 | ORAL_TABLET | Freq: Every day | ORAL | Status: DC
  Administered 2020-07-23 – 2020-07-25 (×3): 1 via ORAL
  Filled 2020-07-23 (×3): qty 1

## 2020-07-23 SURGICAL SUPPLY — 56 items
ATTUNE PS FEM LT SZ 5 CEM KNEE (Femur) ×2 IMPLANT
BAG ZIPLOCK 12X15 (MISCELLANEOUS) IMPLANT
BASEPLATE TIB CMT FB PCKT SZ5 (Knees) ×2 IMPLANT
BLADE SAG 18X100X1.27 (BLADE) ×2 IMPLANT
BLADE SAW SGTL 13X75X1.27 (BLADE) ×2 IMPLANT
BLADE SURG 15 STRL LF DISP TIS (BLADE) ×1 IMPLANT
BLADE SURG 15 STRL SS (BLADE) ×1
BNDG ELASTIC 6X10 VLCR STRL LF (GAUZE/BANDAGES/DRESSINGS) ×2 IMPLANT
BOWL SMART MIX CTS (DISPOSABLE) ×2 IMPLANT
CEMENT HV SMART SET (Cement) ×4 IMPLANT
CLSR STERI-STRIP ANTIMIC 1/2X4 (GAUZE/BANDAGES/DRESSINGS) ×2 IMPLANT
COVER SURGICAL LIGHT HANDLE (MISCELLANEOUS) ×2 IMPLANT
COVER WAND RF STERILE (DRAPES) IMPLANT
CUFF TOURN SGL QUICK 34 (TOURNIQUET CUFF) ×1
CUFF TRNQT CYL 34X4.125X (TOURNIQUET CUFF) ×1 IMPLANT
DECANTER SPIKE VIAL GLASS SM (MISCELLANEOUS) IMPLANT
DRAPE U-SHAPE 47X51 STRL (DRAPES) ×2 IMPLANT
DRESSING AQUACEL AG SP 3.5X10 (GAUZE/BANDAGES/DRESSINGS) IMPLANT
DRSG AQUACEL AG SP 3.5X10 (GAUZE/BANDAGES/DRESSINGS)
DRSG MEPILEX BORDER 4X12 (GAUZE/BANDAGES/DRESSINGS) ×2 IMPLANT
DRSG PAD ABDOMINAL 8X10 ST (GAUZE/BANDAGES/DRESSINGS) ×2 IMPLANT
DURAPREP 26ML APPLICATOR (WOUND CARE) ×4 IMPLANT
ELECT REM PT RETURN 15FT ADLT (MISCELLANEOUS) ×2 IMPLANT
GLOVE SRG 8 PF TXTR STRL LF DI (GLOVE) ×1 IMPLANT
GLOVE SURG ENC MOIS LTX SZ6.5 (GLOVE) ×2 IMPLANT
GLOVE SURG ENC MOIS LTX SZ7.5 (GLOVE) ×2 IMPLANT
GLOVE SURG UNDER POLY LF SZ7 (GLOVE) ×2 IMPLANT
GLOVE SURG UNDER POLY LF SZ8 (GLOVE) ×1
GOWN STRL REUS W/ TWL LRG LVL3 (GOWN DISPOSABLE) ×2 IMPLANT
GOWN STRL REUS W/TWL LRG LVL3 (GOWN DISPOSABLE) ×2
HANDPIECE INTERPULSE COAX TIP (DISPOSABLE) ×1
HOLDER FOLEY CATH W/STRAP (MISCELLANEOUS) ×2 IMPLANT
HOOD PEEL AWAY FLYTE STAYCOOL (MISCELLANEOUS) ×6 IMPLANT
IMMOBILIZER KNEE 20 (SOFTGOODS) ×2
IMMOBILIZER KNEE 20 THIGH 36 (SOFTGOODS) ×1 IMPLANT
INSERT TIB FIX BEARNG SZ 5 5MM (Insert) ×2 IMPLANT
KIT TURNOVER KIT A (KITS) ×2 IMPLANT
MANIFOLD NEPTUNE II (INSTRUMENTS) ×2 IMPLANT
NS IRRIG 1000ML POUR BTL (IV SOLUTION) ×2 IMPLANT
PACK ICE MAXI GEL EZY WRAP (MISCELLANEOUS) ×2 IMPLANT
PACK TOTAL KNEE CUSTOM (KITS) ×2 IMPLANT
PATELLA MEDIAL ATTUN 35MM KNEE (Knees) ×2 IMPLANT
PENCIL SMOKE EVACUATOR (MISCELLANEOUS) ×2 IMPLANT
PIN DRILL FIX HALF THREAD (BIT) ×2 IMPLANT
PIN STEINMAN FIXATION KNEE (PIN) ×2 IMPLANT
PROTECTOR NERVE ULNAR (MISCELLANEOUS) ×2 IMPLANT
SET HNDPC FAN SPRY TIP SCT (DISPOSABLE) ×1 IMPLANT
SET PAD KNEE POSITIONER (MISCELLANEOUS) ×2 IMPLANT
SUT VIC AB 1 CT1 36 (SUTURE) ×4 IMPLANT
SUT VIC AB 2-0 CT1 27 (SUTURE) ×1
SUT VIC AB 2-0 CT1 TAPERPNT 27 (SUTURE) ×1 IMPLANT
SUT VIC AB 3-0 SH 8-18 (SUTURE) ×2 IMPLANT
TRAY FOLEY MTR SLVR 16FR STAT (SET/KITS/TRAYS/PACK) ×2 IMPLANT
TUBE SUCTION HIGH CAP CLEAR NV (SUCTIONS) ×2 IMPLANT
WATER STERILE IRR 1000ML POUR (IV SOLUTION) ×4 IMPLANT
WRAP KNEE MAXI GEL POST OP (GAUZE/BANDAGES/DRESSINGS) ×2 IMPLANT

## 2020-07-23 NOTE — Op Note (Signed)
DATE OF SURGERY:  07/23/2020 TIME: 9:47 AM  PATIENT NAME:  Lori Navarro   AGE: 75 y.o.    PRE-OPERATIVE DIAGNOSIS: Left knee primary localized valgus osteoarthritis  POST-OPERATIVE DIAGNOSIS:  Same  PROCEDURE: LEFT total Knee Arthroplasty  SURGEON:  Eulas Post, MD   ASSISTANT:  Janine Ores, PA-C, present and scrubbed throughout the case, critical for assistance with exposure, retraction, instrumentation, and closure.   OPERATIVE IMPLANTS: Depuy Attune size 5 posterior Stabilized Femur, with a size 5 fixed Bearing Tibia, 5 polyethylene insert with a 35 medialized oval dome polyethylene patella.  PREOPERATIVE INDICATIONS:  Lori Navarro is a 75 y.o. year old female with end stage bone on bone degenerative arthritis of the knee who failed conservative treatment, including injections, antiinflammatories, activity modification, and assistive devices, and had significant impairment of their activities of daily living, and elected for Total Knee Arthroplasty.   The risks, benefits, and alternatives were discussed at length including but not limited to the risks of infection, bleeding, nerve injury, stiffness, blood clots, the need for revision surgery, cardiopulmonary complications, among others, and they were willing to proceed.  OPERATIVE FINDINGS AND UNIQUE ASPECTS OF THE CASE: The patella had substantial osteophyte formation with significant lateral facet disease.  It measured 21 mm before the cut.  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  The fat pad excision was fairly robust, and I initially was worried about the integrity of the patellar tendon, there was some fragmentation of the distal portion of the patella after the cut, however this was all posteriorly, however the tendon itself was 100% intact and tracked well at the completion of the case.  She conformed nicely to a size 5 on the tibia on  the medial side, although the lateral side looked like it would had been better with a 4, but that left some uncovered tibia medially anteriorly and posteriorly.  Therefore I went with the 5, not sure if placement of the Hohmann retractor on the lateral side may have caused some degree of slight bony loss around the rim posteriorly laterally, but nonetheless the 5 was appropriate, during the initial drill placement of the trial the plate may have shifted just slightly anteriorly, and it was just adjacent to the anterior rim.  ESTIMATED BLOOD LOSS: 100 mL  OPERATIVE DESCRIPTION:  The patient was brought to the operative room and placed in a supine position.  Anesthesia was administered.  IV antibiotics were given.  The lower extremity was prepped and draped in the usual sterile fashion.  Time out was performed.  The leg was elevated and exsanguinated and the tourniquet was inflated.  Anterior quadriceps tendon splitting approach was performed.  The patella was everted and osteophytes were removed.  The anterior horn of the medial and lateral meniscus was removed.   The patella was then measured, and cut with the saw.  The thickness before the cut was 21 and after the cut was 15.  A metal shield was used to protect the patella throughout the case.    The distal femur was opened with the drill and the intramedullary distal femoral cutting jig was utilized, set at 5 degrees resecting 9 mm off the distal femur.  Care was taken to protect the collateral ligaments.  Then the extramedullary tibial cutting jig was utilized making the appropriate cut using the anterior tibial crest as a reference building in appropriate posterior slope.  Care was  taken during the cut to protect the medial and collateral ligaments.  The proximal tibia was removed along with the posterior horns of the menisci.  The PCL was sacrificed.    The extensor gap was measured and found to have adequate resection, measuring to a size 5.     The distal femoral sizing jig was applied, taking care to avoid notching.  This was set at 5 degrees of external rotation as there was a moderate amount of lateral femoral hypoplasia.  Then the 4-in-1 cutting jig was applied and the anterior and posterior femur was cut, along with the chamfer cuts.  All posterior osteophytes were removed.  The flexion gap was then measured and was symmetric with the extension gap.  I completed the distal femoral preparation using the appropriate jig to prepare the box.  The proximal tibia sized and prepared accordingly with the reamer and the punch, and then all components were trialed with the poly insert.  The knee was found to have excellent balance and full motion.    The above named components were then cemented into place and all excess cement was removed.  The real polyethylene implant was placed.  After the cement had cured I released the tourniquet and confirmed excellent hemostasis with no major posterior vessel injury.    The knee was easily taken through a range of motion and the patella tracked well and the knee irrigated copiously and the parapatellar and subcutaneous tissue closed with vicryl, and monocryl with steri strips for the skin.  The wounds were injected with marcaine, and dressed with sterile gauze and the patient was awakened and returned to the PACU in stable and satisfactory condition.  There were no complications.  Total tourniquet time was ~85 minutes.

## 2020-07-23 NOTE — Transfer of Care (Signed)
Immediate Anesthesia Transfer of Care Note  Patient: Lori Navarro  Procedure(s) Performed: TOTAL KNEE ARTHROPLASTY (Left Knee)  Patient Location: PACU  Anesthesia Type:MAC combined with regional for post-op pain  Level of Consciousness: awake, alert , oriented and patient cooperative  Airway & Oxygen Therapy: Patient Spontanous Breathing and Patient connected to face mask oxygen  Post-op Assessment: Report given to RN and Post -op Vital signs reviewed and stable  Post vital signs: Reviewed and stable  Last Vitals:  Vitals Value Taken Time  BP 139/91 07/23/20 1021  Temp    Pulse 67 07/23/20 1025  Resp 15 07/23/20 1025  SpO2 85 % 07/23/20 1025  Vitals shown include unvalidated device data.  Last Pain:  Vitals:   07/23/20 6286  TempSrc: Oral  PainSc: 2          Complications: No complications documented.

## 2020-07-23 NOTE — Interval H&P Note (Signed)
History and Physical Interval Note:  07/23/2020 7:17 AM  Lori Navarro  has presented today for surgery, with the diagnosis of degenerative joint disease  left knee.  The various methods of treatment have been discussed with the patient and family. After consideration of risks, benefits and other options for treatment, the patient has consented to  Procedure(s): TOTAL KNEE ARTHROPLASTY (Left) as a surgical intervention.  The patient's history has been reviewed, patient examined, no change in status, stable for surgery.  I have reviewed the patient's chart and labs.  Questions were answered to the patient's satisfaction.     Eulas Post

## 2020-07-23 NOTE — Plan of Care (Signed)
  Problem: Education: Goal: Knowledge of General Education information will improve Description: Including pain rating scale, medication(s)/side effects and non-pharmacologic comfort measures Outcome: Progressing   Problem: Clinical Measurements: Goal: Ability to maintain clinical measurements within normal limits will improve Outcome: Progressing   Problem: Activity: Goal: Risk for activity intolerance will decrease Outcome: Progressing   Problem: Nutrition: Goal: Adequate nutrition will be maintained Outcome: Progressing   Problem: Coping: Goal: Level of anxiety will decrease Outcome: Progressing   Problem: Elimination: Goal: Will not experience complications related to bowel motility Outcome: Progressing   Problem: Pain Managment: Goal: General experience of comfort will improve Outcome: Progressing   Problem: Skin Integrity: Goal: Risk for impaired skin integrity will decrease Outcome: Progressing   Problem: Education: Goal: Knowledge of the prescribed therapeutic regimen will improve Outcome: Progressing   Problem: Skin Integrity: Goal: Will show signs of wound healing Outcome: Progressing

## 2020-07-23 NOTE — Anesthesia Postprocedure Evaluation (Signed)
Anesthesia Post Note  Patient: Lori Navarro  Procedure(s) Performed: TOTAL KNEE ARTHROPLASTY (Left Knee)     Patient location during evaluation: PACU Anesthesia Type: Regional and Spinal Level of consciousness: awake Pain management: pain level controlled Vital Signs Assessment: post-procedure vital signs reviewed and stable Respiratory status: spontaneous breathing, respiratory function stable and patient connected to nasal cannula oxygen Cardiovascular status: blood pressure returned to baseline and stable Postop Assessment: no headache, no backache and no apparent nausea or vomiting Anesthetic complications: no   No complications documented.  Last Vitals:  Vitals:   07/23/20 1158 07/23/20 1348  BP: (!) 143/64 129/69  Pulse: 61 62  Resp: 18 16  Temp: 36.6 C 36.7 C  SpO2: 100%     Last Pain:  Vitals:   07/23/20 1629  TempSrc:   PainSc: 6                  Lori Navarro P Cozetta Seif

## 2020-07-23 NOTE — Anesthesia Procedure Notes (Signed)
Spinal  Patient location during procedure: OR Start time: 07/23/2020 7:35 AM End time: 07/23/2020 7:45 AM Reason for block: surgical anesthesia Staffing Performed: anesthesiologist  Anesthesiologist: Leonides Grills, MD Preanesthetic Checklist Completed: patient identified, IV checked, risks and benefits discussed, surgical consent, monitors and equipment checked, pre-op evaluation and timeout performed Spinal Block Patient position: sitting Prep: DuraPrep Patient monitoring: cardiac monitor, continuous pulse ox and blood pressure Approach: midline Location: L4-5 Injection technique: single-shot Needle Needle type: Pencan  Needle gauge: 24 G Needle length: 9 cm Assessment Sensory level: T10 Events: CSF return Additional Notes Functioning IV was confirmed and monitors were applied. Sterile prep and drape, including hand hygiene and sterile gloves were used. The patient was positioned and the spine was prepped. The skin was anesthetized with lidocaine.  Free flow of clear CSF was obtained prior to injecting local anesthetic into the CSF.  The spinal needle aspirated freely following injection.  The needle was carefully withdrawn.  The patient tolerated the procedure well.

## 2020-07-23 NOTE — Anesthesia Procedure Notes (Signed)
Anesthesia Regional Block: Adductor canal block   Pre-Anesthetic Checklist: ,, timeout performed, Correct Patient, Correct Site, Correct Laterality, Correct Procedure,, site marked, risks and benefits discussed, Surgical consent,  Pre-op evaluation,  At surgeon's request and post-op pain management  Laterality: Left  Prep: chloraprep       Needles:  Injection technique: Single-shot  Needle Type: Echogenic Stimulator Needle     Needle Length: 10cm  Needle Gauge: 20     Additional Needles:   Procedures:,,,, ultrasound used (permanent image in chart),,,,  Narrative:  Start time: 07/23/2020 7:10 AM End time: 07/23/2020 7:20 AM Injection made incrementally with aspirations every 5 mL.  Performed by: Personally  Anesthesiologist: Leonides Grills, MD  Additional Notes: Functioning IV was confirmed and monitors were applied. A time-out was performed. Hand hygiene and sterile gloves were used. The thigh was placed in a frog-leg position and prepped in a sterile fashion. A 20ga BBraun echogenic stimulator needle was placed using ultrasound guidance.  Negative aspiration and negative test dose prior to incremental administration of local anesthetic. The patient tolerated the procedure well.

## 2020-07-23 NOTE — Evaluation (Signed)
Physical Therapy Evaluation Patient Details Name: Lori Navarro MRN: [id number] DOB: 1945-05-28 Today's Date: 07/23/2020   History of Present Illness  Patient is 75 y.o. 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  Clinical Impression  Lori Navarro is a 75 y.o. female POD 0 s/p Lt TKA. Patient reports independence with mobility at baseline. Patient is now limited by functional impairments (see PT problem list below) and requires Mod +2 assist for transfers with RW. Patient was limited this session by pain and was able to take small side steps with RW from bed>recliner with Mod+2 assist. Patient will benefit from continued skilled PT interventions to address impairments and progress towards PLOF. Acute PT will follow to progress mobility and stair training in preparation for safe discharge home.     Follow Up Recommendations Follow surgeon's recommendation for DC plan and follow-up therapies;Home health PT    Equipment Recommendations  Rolling walker with 5" wheels;3in1 (PT)    Recommendations for Other Services       Precautions / Restrictions Precautions Precautions: Fall Restrictions Weight Bearing Restrictions: No Other Position/Activity Restrictions: WBAT      Mobility  Bed Mobility Overal bed mobility: Needs Assistance Bed Mobility: Supine to Sit     Supine to sit: Mod assist;HOB elevated     General bed mobility comments: Cues for sequencing and use of bed rail, assist to bring Rt LE off EOB and to raise trunk. pt rocking at EOB and cues needed for safety.    Transfers Overall transfer level: Needs assistance Equipment used: Rolling walker (2 wheeled) Transfers: Sit to/from [employer] Sit to Stand: Mod assist;+2 physical assistance;+2 safety/equipment;From elevated surface Stand pivot transfers: Mod assist;+2 physical assistance;+2 safety/equipment;From elevated surface       General transfer comment: Min  cues for safe technique with RW. pt with great difficulty for power up from EOB and Mod +2 assist required to initiate and complete rise. Patient required Mod +2 assist for side stepping and walker management to move bed>chair. cues for safe reach back with bil UE to control lowering.  Ambulation/Gait             General Gait Details: deferred due to pain.  Stairs            Wheelchair Mobility    Modified Rankin (Stroke Patients Only)       Balance Overall balance assessment: Needs assistance Sitting-balance support: Feet supported;Bilateral upper extremity supported Sitting balance-Leahy Scale: Fair     Standing balance support: During functional activity;Bilateral upper extremity supported Standing balance-Leahy Scale: Poor                               Pertinent Vitals/Pain Pain Assessment: 0-10 Pain Score: 7  Pain Location: Rt knee Pain Descriptors / Indicators: Aching;Discomfort;Guarding;Grimacing Pain Intervention(s): Limited activity within patient's tolerance;Monitored during session;Premedicated before session;Repositioned;Ice applied    Home Living Family/patient expects to be discharged to:: Private residence Living Arrangements: Spouse/significant other Available Help at Discharge: Family Type of Home: House Home Access: Stairs to enter Entrance Stairs-Rails: Right Entrance Stairs-Number of Steps: 7 Home Layout: One level Home Equipment: None      Prior Function Level of Independence: Independent               Hand Dominance   Dominant Hand: Right    Extremity/Trunk Assessment   Upper Extremity Assessment Upper Extremity Assessment: Generalized  weakness (pt with great difficulty using bil UE's for power up from EOB, not formally tested)    Lower Extremity Assessment Lower Extremity Assessment: Generalized weakness;LLE deficits/detail LLE Deficits / Details: limited by pain, good quad set activation LLE: Unable to  fully assess due to pain;Unable to fully assess due to immobilization LLE Sensation: WNL LLE Coordination: WNL    Cervical / Trunk Assessment Cervical / Trunk Assessment: Normal  Communication   Communication: No difficulties  Cognition Arousal/Alertness: Awake/alert Behavior During Therapy: WFL for tasks assessed/performed Overall Cognitive Status: Within Functional Limits for tasks assessed                                        General Comments      Exercises     Assessment/Plan    PT Assessment Patient needs continued PT services  PT Problem List Decreased strength;Decreased range of motion;Decreased activity tolerance;Decreased balance;Decreased mobility;Decreased knowledge of use of DME;Decreased safety awareness;Decreased knowledge of precautions;Pain;Obesity       PT Treatment Interventions DME instruction;Gait training;Stair training;Functional mobility training;Therapeutic activities;Therapeutic exercise;Balance training;Neuromuscular re-education;Patient/family education    PT Goals (Current goals can be found in the Care Plan section)  Acute Rehab PT Goals Patient Stated Goal: get back to riding her bike PT Goal Formulation: With patient/family Time For Goal Achievement: 07/30/20 Potential to Achieve Goals: Good    Frequency 7X/week   Barriers to discharge        Co-evaluation               AM-PAC PT "6 Clicks" Mobility  Outcome Measure Help needed turning from your back to your side while in a flat bed without using bedrails?: A Little Help needed moving from lying on your back to sitting on the side of a flat bed without using bedrails?: A Lot Help needed moving to and from a bed to a chair (including a wheelchair)?: A Lot Help needed standing up from a chair using your arms (e.g., wheelchair or bedside chair)?: A Lot Help needed to walk in hospital room?: A Lot Help needed climbing 3-5 steps with a railing? : A Lot 6 Click  Score: 13    End of Session Equipment Utilized During Treatment: Gait belt;Left knee immobilizer Activity Tolerance: Patient limited by pain Patient left: in chair;with call bell/phone within reach;with chair alarm set;with family/visitor present Nurse Communication: Mobility status (nausea meds) PT Visit Diagnosis: Muscle weakness (generalized) (M62.81);Difficulty in walking, not elsewhere classified (R26.2);Pain Pain - Right/Left: Left Pain - part of body: Knee    Time: 1535-1600 PT Time Calculation (min) (ACUTE ONLY): 25 min   Charges:   PT Evaluation $PT Eval Low Complexity: 1 Low PT Treatments $Therapeutic Activity: 8-22 mins        Wynn Maudlin, DPT Acute Rehabilitation Services Office [phone] Pager [phone]   Anitra Lauth 07/23/2020, 4:21 PM

## 2020-07-23 NOTE — Discharge Instructions (Signed)
INSTRUCTIONS AFTER JOINT REPLACEMENT   o Remove items at home which could result in a fall. This includes throw rugs or furniture in walking pathways o ICE to the affected joint every three hours while awake for 30 minutes at a time, for at least the first 3-5 days, and then as needed for pain and swelling.  Continue to use ice for pain and swelling. You may notice swelling that will progress down to the foot and ankle.  This is normal after surgery.  Elevate your leg when you are not up walking on it.   o Continue to use the breathing machine you got in the hospital (incentive spirometer) which will help keep your temperature down.  It is common for your temperature to cycle up and down following surgery, especially at night when you are not up moving around and exerting yourself.  The breathing machine keeps your lungs expanded and your temperature down.   DIET:  As you were doing prior to hospitalization, we recommend a well-balanced diet.  DRESSING / WOUND CARE / SHOWERING  You may change your dressing 3-5 days after surgery.  Then change the dressing every day with sterile gauze.  Please use good hand washing techniques before changing the dressing.  Do not use any lotions or creams on the incision until instructed by your surgeon.  ACTIVITY  o Increase activity slowly as tolerated, but follow the weight bearing instructions below.   o No driving for 6 weeks or until further direction given by your physician.  You cannot drive while taking narcotics.  o No lifting or carrying greater than 10 lbs. until further directed by your surgeon. o Avoid periods of inactivity such as sitting longer than an hour when not asleep. This helps prevent blood clots.  o You may return to work once you are authorized by your doctor.     WEIGHT BEARING   Weight bearing as tolerated with assist device (walker, cane, etc) as directed, use it as long as suggested by your surgeon or therapist, typically at  least 4-6 weeks.   EXERCISES  Results after joint replacement surgery are often greatly improved when you follow the exercise, range of motion and muscle strengthening exercises prescribed by your doctor. Safety measures are also important to protect the joint from further injury. Any time any of these exercises cause you to have increased pain or swelling, decrease what you are doing until you are comfortable again and then slowly increase them. If you have problems or questions, call your caregiver or physical therapist for advice.   Rehabilitation is important following a joint replacement. After just a few days of immobilization, the muscles of the leg can become weakened and shrink (atrophy).  These exercises are designed to build up the tone and strength of the thigh and leg muscles and to improve motion. Often times heat used for twenty to thirty minutes before working out will loosen up your tissues and help with improving the range of motion but do not use heat for the first two weeks following surgery (sometimes heat can increase post-operative swelling).   These exercises can be done on a training (exercise) mat, on the floor, on a table or on a bed. Use whatever works the best and is most comfortable for you.    Use music or television while you are exercising so that the exercises are a pleasant break in your day. This will make your life better with the exercises acting as a break   in your routine that you can look forward to.   Perform all exercises about fifteen times, three times per day or as directed.  You should exercise both the operative leg and the other leg as well.  Exercises include:   . Quad Sets - Tighten up the muscle on the front of the thigh (Quad) and hold for 5-10 seconds.   . Straight Leg Raises - With your knee straight (if you were given a brace, keep it on), lift the leg to 60 degrees, hold for 3 seconds, and slowly lower the leg.  Perform this exercise against  resistance later as your leg gets stronger.  . Leg Slides: Lying on your back, slowly slide your foot toward your buttocks, bending your knee up off the floor (only go as far as is comfortable). Then slowly slide your foot back down until your leg is flat on the floor again.  . Angel Wings: Lying on your back spread your legs to the side as far apart as you can without causing discomfort.  . Hamstring Strength:  Lying on your back, push your heel against the floor with your leg straight by tightening up the muscles of your buttocks.  Repeat, but this time bend your knee to a comfortable angle, and push your heel against the floor.  You may put a pillow under the heel to make it more comfortable if necessary.   A rehabilitation program following joint replacement surgery can speed recovery and prevent re-injury in the future due to weakened muscles. Contact your doctor or a physical therapist for more information on knee rehabilitation.    CONSTIPATION  Constipation is defined medically as fewer than three stools per week and severe constipation as less than one stool per week.  Even if you have a regular bowel pattern at home, your normal regimen is likely to be disrupted due to multiple reasons following surgery.  Combination of anesthesia, postoperative narcotics, change in appetite and fluid intake all can affect your bowels.   YOU MUST use at least one of the following options; they are listed in order of increasing strength to get the job done.  They are all available over the counter, and you may need to use some, POSSIBLY even all of these options:    Drink plenty of fluids (prune juice may be helpful) and high fiber foods Colace 100 mg by mouth twice a day  Senokot for constipation as directed and as needed Dulcolax (bisacodyl), take with full glass of water  Miralax (polyethylene glycol) once or twice a day as needed.  If you have tried all these things and are unable to have a bowel  movement in the first 3-4 days after surgery call either your surgeon or your primary doctor.    If you experience loose stools or diarrhea, hold the medications until you stool forms back up.  If your symptoms do not get better within 1 week or if they get worse, check with your doctor.  If you experience "the worst abdominal pain ever" or develop nausea or vomiting, please contact the office immediately for further recommendations for treatment.   ITCHING:  If you experience itching with your medications, try taking only a single pain pill, or even half a pain pill at a time.  You can also use Benadryl over the counter for itching or also to help with sleep.   TED HOSE STOCKINGS:  Use stockings on both legs until for at least 2 weeks or as   directed by physician office. They may be removed at night for sleeping.  MEDICATIONS:  See your medication summary on the "After Visit Summary" that nursing will review with you.  You may have some home medications which will be placed on hold until you complete the course of blood thinner medication.  It is important for you to complete the blood thinner medication as prescribed.  PRECAUTIONS:  If you experience chest pain or shortness of breath - call 911 immediately for transfer to the hospital emergency department.   If you develop a fever greater that 101 F, purulent drainage from wound, increased redness or drainage from wound, foul odor from the wound/dressing, or calf pain - CONTACT YOUR SURGEON.                                                   FOLLOW-UP APPOINTMENTS:  If you do not already have a post-op appointment, please call the office for an appointment to be seen by your surgeon.  Guidelines for how soon to be seen are listed in your "After Visit Summary", but are typically between 1-4 weeks after surgery.  OTHER INSTRUCTIONS:   Knee Replacement:  Do not place pillow under knee, focus on keeping the knee straight while resting.    POST-OPERATIVE OPIOID TAPER INSTRUCTIONS: . It is important to wean off of your opioid medication as soon as possible. If you do not need pain medication after your surgery it is ok to stop day one. . Opioids include: o Codeine, Hydrocodone(Norco, Vicodin), Oxycodone(Percocet, oxycontin) and hydromorphone amongst others.  . Long term and even short term use of opiods can cause: o Increased pain response o Dependence o Constipation o Depression o Respiratory depression o And more.  . Withdrawal symptoms can include o Flu like symptoms o Nausea, vomiting o And more . Techniques to manage these symptoms o Hydrate well o Eat regular healthy meals o Stay active o Use relaxation techniques(deep breathing, meditating, yoga) . Do Not substitute Alcohol to help with tapering . If you have been on opioids for less than two weeks and do not have pain than it is ok to stop all together.  . Plan to wean off of opioids o This plan should start within one week post op of your joint replacement. o Maintain the same interval or time between taking each dose and first decrease the dose.  o Cut the total daily intake of opioids by one tablet each day o Next start to increase the time between doses. o The last dose that should be eliminated is the evening dose.     MAKE SURE YOU:  . Understand these instructions.  . Get help right away if you are not doing well or get worse.    Thank you for letting us be a part of your medical care team.  It is a privilege we respect greatly.  We hope these instructions will help you stay on track for a fast and full recovery!      

## 2020-07-24 ENCOUNTER — Encounter (HOSPITAL_COMMUNITY): Payer: Self-pay | Admitting: Orthopedic Surgery

## 2020-07-24 DIAGNOSIS — Z79899 Other long term (current) drug therapy: Secondary | ICD-10-CM | POA: Diagnosis not present

## 2020-07-24 DIAGNOSIS — M21062 Valgus deformity, not elsewhere classified, left knee: Secondary | ICD-10-CM | POA: Diagnosis present

## 2020-07-24 DIAGNOSIS — I10 Essential (primary) hypertension: Secondary | ICD-10-CM | POA: Diagnosis present

## 2020-07-24 DIAGNOSIS — Z7984 Long term (current) use of oral hypoglycemic drugs: Secondary | ICD-10-CM | POA: Diagnosis not present

## 2020-07-24 DIAGNOSIS — Z9884 Bariatric surgery status: Secondary | ICD-10-CM | POA: Diagnosis not present

## 2020-07-24 DIAGNOSIS — Z87440 Personal history of urinary (tract) infections: Secondary | ICD-10-CM | POA: Diagnosis not present

## 2020-07-24 DIAGNOSIS — M1712 Unilateral primary osteoarthritis, left knee: Secondary | ICD-10-CM | POA: Diagnosis present

## 2020-07-24 DIAGNOSIS — Z9851 Tubal ligation status: Secondary | ICD-10-CM | POA: Diagnosis not present

## 2020-07-24 DIAGNOSIS — R52 Pain, unspecified: Secondary | ICD-10-CM | POA: Diagnosis present

## 2020-07-24 DIAGNOSIS — Z90710 Acquired absence of both cervix and uterus: Secondary | ICD-10-CM | POA: Diagnosis not present

## 2020-07-24 DIAGNOSIS — E119 Type 2 diabetes mellitus without complications: Secondary | ICD-10-CM | POA: Diagnosis present

## 2020-07-24 DIAGNOSIS — M25762 Osteophyte, left knee: Secondary | ICD-10-CM | POA: Diagnosis present

## 2020-07-24 LAB — CBC
HCT: 32.9 % — ABNORMAL LOW (ref 36.0–46.0)
Hemoglobin: 10.5 g/dL — ABNORMAL LOW (ref 12.0–15.0)
MCH: 30.7 pg (ref 26.0–34.0)
MCHC: 31.9 g/dL (ref 30.0–36.0)
MCV: 96.2 fL (ref 80.0–100.0)
Platelets: 196 10*3/uL (ref 150–400)
RBC: 3.42 MIL/uL — ABNORMAL LOW (ref 3.87–5.11)
RDW: 13.1 % (ref 11.5–15.5)
WBC: 8 10*3/uL (ref 4.0–10.5)
nRBC: 0 % (ref 0.0–0.2)

## 2020-07-24 LAB — BASIC METABOLIC PANEL
Anion gap: 7 (ref 5–15)
BUN: 14 mg/dL (ref 8–23)
CO2: 25 mmol/L (ref 22–32)
Calcium: 8.5 mg/dL — ABNORMAL LOW (ref 8.9–10.3)
Chloride: 102 mmol/L (ref 98–111)
Creatinine, Ser: 0.64 mg/dL (ref 0.44–1.00)
GFR, Estimated: >60 mL/min (ref >60)
Glucose, Bld: 126 mg/dL — ABNORMAL HIGH (ref 70–99)
Potassium: 3.6 mmol/L (ref 3.5–5.1)
Sodium: 134 mmol/L — ABNORMAL LOW (ref 135–145)

## 2020-07-24 MED ORDER — HYDROMORPHONE HCL 1 MG/ML IJ SOLN
0.5000 mg | INTRAMUSCULAR | Status: DC | PRN
  Administered 2020-07-24: 0.5 mg via INTRAVENOUS
  Administered 2020-07-24: 1 mg via INTRAVENOUS
  Filled 2020-07-24 (×2): qty 1

## 2020-07-24 NOTE — TOC Initial Note (Signed)
Transition of Care [facility]) - Initial/Assessment Note   Patient Details  Name: Lori Navarro MRN: [id number] Date of Birth: 19-Jul-1945  [facility]) CM/SW Contact:    Sherie Don, LCSW Phone Number: 07/24/2020, 2:51 PM  Clinical Narrative: Patient is a 75 year old female who is under observation for left total knee arthroplasty. CSW met with patient and her family. Patient is expected to discharge home tomorrow with HHPT through [place]. [employer] has delivered the rolling walker and 3N1 to patient's room. TOC to follow.  Expected Discharge Plan: [place] Barriers to Discharge: Continued Medical Work up  Patient Goals and CMS Choice Patient states their goals for this hospitalization and ongoing recovery are:: Discharge home with HHPT through [email or web address].gov Compare Post Acute Care list provided to:: Patient Choice offered to / list presented to : Patient  Expected Discharge Plan and Services Expected Discharge Plan: [place] In-house Referral: Clinical Social Work Post Acute Care Choice: [place] Living arrangements for the past 2 months: [place]             DME Arranged: 3-N-1,Walker rolling DME Agency: Economist spoke with at DME Agency: Pre-arranged by orthopedist HH Arranged: PT [place] Agency: [facility] [employer] [facility] [employer] Representative spoke with at [place]: Pre-arranged before surgery  Prior Living Arrangements/Services Living arrangements for the past 2 months: Single Family Home Lives with:: Spouse Patient language and need for interpreter reviewed:: Yes Do you feel safe going back to the place where you live?: Yes      Need for Family Participation in Patient Care: No (Comment) Care giver support system in place?: Yes (comment) Criminal Activity/Legal Involvement Pertinent to Current Situation/Hospitalization: No - Comment as  needed  Activities of Daily Living Home Assistive Devices/Equipment: None ADL Screening (condition at time of admission) Patient's cognitive ability adequate to safely complete daily activities?: Yes Is the patient deaf or have difficulty hearing?: No Does the patient have difficulty seeing, even when wearing glasses/contacts?: No Does the patient have difficulty concentrating, remembering, or making decisions?: No Patient able to express need for assistance with ADLs?: Yes Does the patient have difficulty dressing or bathing?: No Independently performs ADLs?: Yes (appropriate for developmental age) Does the patient have difficulty walking or climbing stairs?: Yes Weakness of Legs: Left Weakness of Arms/Hands: None  Emotional Assessment Appearance:: Appears stated age Attitude/Demeanor/Rapport: Lethargic Affect (typically observed): Accepting Orientation: : Oriented to Self,Oriented to Place,Oriented to  Time,Oriented to Situation Alcohol / Substance Use: Not Applicable Psych Involvement: No (comment)  Admission diagnosis:  S/P total knee arthroplasty, left [Z96.652] Patient Active Problem List   Diagnosis Date Noted  . S/P total knee arthroplasty, left 07/23/2020  . Osteoarthritis of left knee 07/08/2020   PCP:  System, Provider Not In Pharmacy:   [place] [facility], [place] [address] Phone: [phone] Fax: [place], [place] [address] Phone: [phone] Fax: [phone]  Readmission Risk Interventions No flowsheet data found.

## 2020-07-24 NOTE — Progress Notes (Signed)
Physical Therapy Treatment Patient Details Name: Lori Navarro MRN: [id number] DOB: 11-26-1945 Today's Date: 07/24/2020    History of Present Illness Patient is 75 y.o. 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    PT Comments    Progressing slowly but improved overall this session. Continue PT in acute setting   Follow Up Recommendations  Follow surgeon's recommendation for DC plan and follow-up therapies;Home health PT     Equipment Recommendations  Rolling walker with 5" wheels;3in1 (PT)    Recommendations for Other Services       Precautions / Restrictions Precautions Precautions: Fall Precaution Comments: instructed no pillow under knee Required Braces or Orthoses: Knee Immobilizer - Right Knee Immobilizer - Right: On when out of bed or walking Restrictions Weight Bearing Restrictions: No Other Position/Activity Restrictions: WBAT    Mobility  Bed Mobility Overal bed mobility: Needs Assistance Bed Mobility: Supine to Sit     Supine to sit: Min guard;HOB elevated     General bed mobility comments: incr time, pt able to use gait belt as leg lifter to self assist; verbal cues for sequencing and encouragement needed    Transfers Overall transfer level: Needs assistance Equipment used: Rolling walker (2 wheeled) Transfers: Sit to/from [employer] Sit to Stand: Mod assist Stand pivot transfers: Mod assist       General transfer comment: cues for hand placement and LE position. min assist for balance and to steady for stand pivot  Ambulation/Gait Ambulation/Gait assistance: Min assist Gait Distance (Feet): 24 Feet Assistive device: Rolling walker (2 wheeled) Gait Pattern/deviations: Step-to pattern;Decreased stance time - left Gait velocity: decreased   General Gait Details: verbal cues and encouragement for sequence and to incr distance; chair follow for safety. 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 present with anxiety/pain/impaired focus (meds)      Exercises      General Comments        Pertinent Vitals/Pain Pain Assessment: 0-10 Pain Score: 7  Pain Location: Rt knee Pain Descriptors / Indicators: Aching;Discomfort;Guarding;Grimacing Pain Intervention(s): Limited activity within patient's tolerance;Monitored during session;Premedicated before session;Repositioned;Ice applied    Home Living                      Prior Function            PT Goals (current goals can now be found in the care plan section) Acute Rehab PT Goals Patient Stated Goal: get back to riding her bike PT Goal Formulation: With patient/family Time For Goal Achievement: 07/30/20 Potential to Achieve Goals: Good Progress towards PT goals: Progressing toward goals    Frequency  7X/week      PT Plan Current plan remains appropriate    Co-evaluation              AM-PAC PT "6 Clicks" Mobility   Outcome Measure  Help needed turning from your back to your side while in a flat bed without using bedrails?: A Little Help needed moving from lying on your back to sitting on the side of a flat bed without using bedrails?: A Lot Help needed moving to and from a bed to a chair (including a wheelchair)?: A Lot Help needed standing up from a chair using your arms (e.g., wheelchair or bedside chair)?: A Lot Help needed to walk in hospital room?: A Lot Help needed climbing 3-5 steps with a railing? : Total 6  Click Score: 12    End of Session Equipment Utilized During Treatment: Gait belt Activity Tolerance: Patient limited by fatigue Patient left: in chair;with call bell/phone within reach;with chair alarm set Nurse Communication: Mobility status PT Visit Diagnosis: Muscle weakness (generalized) (M62.81);Difficulty in walking, not elsewhere classified (R26.2);Pain Pain - Right/Left: Left Pain - part of body: Knee     Time: 7412-8786 PT Time Calculation (min) (ACUTE ONLY): 23 min  Charges:  $Gait Training: 23-37 mins $Therapeutic Activity: 8-22 mins                     Delice Bison, PT  Acute Rehab Dept ([facility]) [phone] Pager [phone]  07/24/2020    [facility] 07/24/2020, 5:30 PM

## 2020-07-24 NOTE — Progress Notes (Signed)
Subjective: 1 Day Post-Op s/p Procedure(s): TOTAL KNEE ARTHROPLASTY   Patient is alert, oriented. Tearful, she states pain is severe and IV morphine has not helped. Denies chest pain, SOB, Calf pain. No nausea/vomiting. Is passing flatus. No other complaints.  Objective:  PE: VITALS:   Vitals:   07/23/20 1756 07/23/20 2117 07/24/20 0123 07/24/20 0552  BP: (!) 128/58 (!) 115/50 138/60 124/61  Pulse: 64 62 66 66  Resp:  16 16 18   Temp: 97.9 F (36.6 C) 98.1 F (36.7 C) 97.9 F (36.6 C) 97.8 F (36.6 C)  TempSrc:  Oral Oral Oral  SpO2: 94% 96% 100% 100%  Weight:       Normal respiratory effort ABD soft Sensation intact distally Intact pulses distally Dorsiflexion/Plantar flexion intact Incision: scant drainage  LABS  Results for orders placed or performed during the hospital encounter of 07/23/20 (from the past 24 hour(s))  Glucose, capillary     Status: None   Collection Time: 07/23/20 10:29 AM  Result Value Ref Range   Glucose-Capillary 97 70 - 99 mg/dL  CBC     Status: Abnormal   Collection Time: 07/24/20  3:23 AM  Result Value Ref Range   WBC 8.0 4.0 - 10.5 K/uL   RBC 3.42 (L) 3.87 - 5.11 MIL/uL   Hemoglobin 10.5 (L) 12.0 - 15.0 g/dL   HCT 07/26/20 (L) 44.0 - 34.7 %   MCV 96.2 80.0 - 100.0 fL   MCH 30.7 26.0 - 34.0 pg   MCHC 31.9 30.0 - 36.0 g/dL   RDW 42.5 95.6 - 38.7 %   Platelets 196 150 - 400 K/uL   nRBC 0.0 0.0 - 0.2 %  Basic metabolic panel     Status: Abnormal   Collection Time: 07/24/20  3:23 AM  Result Value Ref Range   Sodium 134 (L) 135 - 145 mmol/L   Potassium 3.6 3.5 - 5.1 mmol/L   Chloride 102 98 - 111 mmol/L   CO2 25 22 - 32 mmol/L   Glucose, Bld 126 (H) 70 - 99 mg/dL   BUN 14 8 - 23 mg/dL   Creatinine, Ser 07/26/20 0.44 - 1.00 mg/dL   Calcium 8.5 (L) 8.9 - 10.3 mg/dL   GFR, Estimated 3.32 >95 mL/min   Anion gap 7 5 - 15    DG Knee Left Port  Result Date: 07/23/2020 CLINICAL DATA:  Total knee replacement. EXAM: PORTABLE LEFT KNEE - 1-2  VIEW COMPARISON:  No recent. FINDINGS: Total left knee replacement.  Hardware intact.  Anatomic alignment. 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 Electronically Signed   By: 07/25/2020  Register   On: 07/23/2020 13:00    Assessment/Plan: Osteoarthritis of left knee 1 Day Post-Op s/p Procedure(s): TOTAL KNEE ARTHROPLASTY Weightbearing: WBAT LLE Insicional and dressing care: Dressings left intact until follow-up VTE prophylaxis: Aspirin 325mg  BID x 30 days Pain control: patient still in significant pain with hydrocodone and IV morphine. 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 Will try IV dilaudid for better pain control today rather than morphine. If pain control better with IV dilaudid will add on PO dilaudid on discharge Follow - up plan: 2 weeks with Dr. 07/25/2020 Dispo: pending working with PT today, if unable to progress due to pain control or inability to wean of IV pain medication, may need to keep overnight for pain control  Contact information:   Weekdays 8-5 Dion Saucier, PA-C [phone] A fter hours and holidays please check [email or web address] for group  call information for [facility]  Armida Sans 07/24/2020, 8:28 AM

## 2020-07-24 NOTE — Care Management Obs Status (Signed)
MEDICARE OBSERVATION STATUS NOTIFICATION   Patient Details  Name: Lori Navarro MRN: [id number] Date of Birth: [birth year]   Medicare Observation Status Notification Given:  Yes    Ewing Schlein, LCSW 07/24/2020, 2:42 PM

## 2020-07-24 NOTE — Progress Notes (Signed)
Physical Therapy Treatment Patient Details Name: Lori Navarro MRN: [id number] DOB: 1945/05/09 Today's Date: 07/24/2020    History of Present Illness Patient is 75 y.o. 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  Assisted to EOB was difficult and nearly required 10 min.  General bed mobility comments: with difficulty.  Cues for sequencing and use of bed rail, assist to bring Rt LE off EOB and to raise trunk. pt rocking at EOB and cues needed for safety.General transfer comment: 75% VC's on proper tech and safety. With difficulty self rising and present with some anxiety with increased pain.  Required positive reinforcement and increased time to decrease her anxiety.General Gait Details: very limited amb distance due to effort, pain, anxiety requiring + 2 assist for safety such that recliner was following.  Had pt wear KI for increased support L knee. Performed a few but limited TE's followed by ICE.   Follow Up Recommendations  Follow surgeon's recommendation for DC plan and follow-up therapies;Home health PT     Equipment Recommendations  Rolling walker with 5" wheels;3in1 (PT)    Recommendations for Other Services       Precautions / Restrictions Precautions Precautions: Fall Precaution Comments: instructed no pillow under knee Required Braces or Orthoses: Knee Immobilizer - Right Knee Immobilizer - Right: On when out of bed or walking Restrictions Weight Bearing Restrictions: No Other Position/Activity Restrictions: WBAT....Marland Kitchenapplied and used KI for increased support/comfort/pain for amb    Mobility  Bed Mobility Overal bed mobility: Needs Assistance Bed Mobility: Supine to Sit     Supine to sit: Mod assist;HOB elevated     General bed mobility comments: with difficulty.   Cues for sequencing and use of bed rail, assist to bring Rt LE off EOB and to raise trunk. pt rocking at EOB and cues needed for safety.    Transfers Overall transfer level: Needs assistance Equipment used: Rolling walker (2 wheeled) Transfers: Sit to/from [employer] Sit to Stand: Mod assist;+2 physical assistance;+2 safety/equipment;From elevated surface Stand pivot transfers: Mod assist;+2 physical assistance;+2 safety/equipment;From elevated surface       General transfer comment: 75% VC's on proper tech and safety. With difficulty self rising and present with some anxiety with increased pain.  Required positive reinforcement and increased time to decrease her anxiety.  Ambulation/Gait Ambulation/Gait assistance: Mod assist;+2 safety/equipment Gait Distance (Feet): 5 Feet Assistive device: Rolling walker (2 wheeled) Gait Pattern/deviations: Step-to pattern;Decreased stance time - left Gait velocity: decreased   General Gait Details: very limited amb distance due to effort, pain, anxiety requiring + 2 assist for safety such that recliner was following.  Had pt wear KI for increased support L knee.   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 (meds)      Exercises   10 reps AP and knee presses   General Comments        Pertinent Vitals/Pain Pain Assessment: 0-10 Pain Score: 9  Pain Location: Rt knee Pain Descriptors / Indicators: Aching;Discomfort;Guarding;Grimacing Pain Intervention(s): Monitored  during session;Premedicated before session;Repositioned;Ice applied    Home Living                      Prior Function            PT Goals (current goals can now be found in the care plan section) Progress towards PT goals: Progressing toward goals    Frequency    7X/week      PT Plan Current plan remains appropriate    Co-evaluation              AM-PAC PT "6 Clicks" Mobility   Outcome Measure  Help needed turning from your back to your side while in a flat bed without using bedrails?: A Little Help needed moving from lying on your back to sitting on the side of a flat bed without using bedrails?: A Lot Help needed moving to and from a bed to a chair (including a wheelchair)?: A Lot Help needed standing up from a chair using your arms (e.g., wheelchair or bedside chair)?: A Lot Help needed to walk in hospital room?: A Lot Help needed climbing 3-5 steps with a railing? : Total 6 Click Score: 12    End of Session Equipment Utilized During Treatment: Gait belt;Left knee immobilizer Activity Tolerance: Patient limited by pain Patient left: in chair;with call bell/phone within reach;with chair alarm set;with family/visitor present Nurse Communication: Mobility status PT Visit Diagnosis: Muscle weakness (generalized) (M62.81);Difficulty in walking, not elsewhere classified (R26.2);Pain Pain - Right/Left: Left Pain - part of body: Knee     Time: 9509-3267 PT Time Calculation (min) (ACUTE ONLY): 25 min  Charges:  $Gait Training: 8-22 mins $Therapeutic Activity: 8-22 mins                     Felecia Shelling  PTA Acute  Rehabilitation Services Pager      [phone] Office      [phone]

## 2020-07-25 LAB — CBC
HCT: 32.6 % — ABNORMAL LOW (ref 36.0–46.0)
Hemoglobin: 10.5 g/dL — ABNORMAL LOW (ref 12.0–15.0)
MCH: 30.5 pg (ref 26.0–34.0)
MCHC: 32.2 g/dL (ref 30.0–36.0)
MCV: 94.8 fL (ref 80.0–100.0)
Platelets: 212 10*3/uL (ref 150–400)
RBC: 3.44 MIL/uL — ABNORMAL LOW (ref 3.87–5.11)
RDW: 13.2 % (ref 11.5–15.5)
WBC: 8 10*3/uL (ref 4.0–10.5)
nRBC: 0 % (ref 0.0–0.2)

## 2020-07-25 MED ORDER — HYDROMORPHONE HCL 2 MG PO TABS: 2.0000 mg | ORAL_TABLET | Freq: Four times a day (QID) | ORAL | 0 refills | Status: AC | PRN

## 2020-07-25 MED ORDER — HYDROMORPHONE HCL 2 MG PO TABS
2.0000 mg | ORAL_TABLET | ORAL | Status: DC | PRN
  Administered 2020-07-25 (×2): 2 mg via ORAL
  Filled 2020-07-25 (×2): qty 1

## 2020-07-25 NOTE — TOC Transition Note (Signed)
Transition of Care [facility]) - CM/SW Discharge Note  Patient Details  Name: Lori Navarro MRN: [id number] Date of Birth: 08/28/1945  Transition of Care [facility]) CM/SW Contact:  Ewing Schlein, LCSW Phone Number: 07/25/2020, 11:54 AM  Clinical Narrative: Patient will discharge home today. CSW notified Kathlene November with [facility] of discharge. TOC signing off.  Final next level of care: Home w Home Health Services Barriers to Discharge: Barriers Resolved  Patient Goals and CMS Choice Patient states their goals for this hospitalization and ongoing recovery are:: Discharge home with HHPT through [facility] CMS Medicare.gov Compare Post Acute Care list provided to:: Patient Choice offered to / list presented to : Patient  Discharge Plan and Services In-house Referral: Clinical Social Work Post Acute Care Choice: Home Health,Durable Medical Equipment          DME Arranged: 3-N-1,Walker rolling DME Agency: Editor, commissioning spoke with at DME Agency: Pre-arranged by orthopedist HH Arranged: PT HH Agency: [facility] [employer] Representative spoke with at [facility] Agency: Pre-arranged before surgery  Readmission Risk Interventions No flowsheet data found.

## 2020-07-25 NOTE — Discharge Summary (Signed)
Discharge Summary  Patient ID: Lori Navarro MRN: [id number] DOB/AGE: Jan 27, 1946 75 y.o.  Admit date: 07/23/2020 Discharge date: 07/25/2020  Admission Diagnoses:  Left knee osteoarthritis  Discharge Diagnoses:  Active Problems:   Osteoarthritis of left knee   S/P total knee arthroplasty, left   Inadequate pain control   Past Medical History:  Diagnosis Date  . Anemia 2020   took iron  . Arthritis    knee  . Chronic UTI   . Diabetes mellitus without complication (HCC)    pt reports pre but takes metformin  . Heart murmur   . Hypertension   . Peripheral edema    mild    Surgeries: Procedure(s): TOTAL KNEE ARTHROPLASTY on 07/23/2020   Consultants (if any):   Discharged Condition: Improved  Hospital Course: Lori Navarro is an 75 y.o. female who was admitted 07/23/2020 with a diagnosis of <principal problem not specified> and went to the operating room on 07/23/2020 and underwent the above named procedures.    She was given perioperative antibiotics:  Anti-infectives (From admission, onward)   Start     Dose/Rate Route Frequency Ordered Stop   07/23/20 1930  vancomycin (VANCOREADY) IVPB 1000 mg/200 mL        1,000 mg 200 mL/hr over 60 Minutes Intravenous Every 12 hours 07/23/20 1159 07/23/20 2100   07/23/20 1245  sulfamethoxazole-trimethoprim (BACTRIM) 400-80 MG per tablet 1 tablet        1 tablet Oral Daily 07/23/20 1158     07/23/20 0600  vancomycin (VANCOCIN) IVPB 1000 mg/200 mL premix        1,000 mg 200 mL/hr over 60 Minutes Intravenous On call to O.R. 07/23/20 5643 07/23/20 0742    .  She was given sequential compression devices, early ambulation, and aspirin for DVT prophylaxis.  She benefited maximally from the hospital stay, she was kept another night due to inadequate pain control. After another day inpatient, patient was able to discharge with pain under control and after walking with PT.   Recent vital signs:  Vitals:   07/25/20 0611 07/25/20 1327  BP: (!)  150/69 (!) 131/57  Pulse: 74 89  Resp: 18 18  Temp: 98.2 F (36.8 C)   SpO2: 99% 97%    Recent laboratory studies:  Lab Results  Component Value Date   HGB 10.5 (L) 07/25/2020   HGB 10.5 (L) 07/24/2020   HGB 12.7 07/11/2020   Lab Results  Component Value Date   WBC 8.0 07/25/2020   PLT 212 07/25/2020   No results found for: INR Lab Results  Component Value Date   NA 134 (L) 07/24/2020   K 3.6 07/24/2020   CL 102 07/24/2020   CO2 25 07/24/2020   BUN 14 07/24/2020   CREATININE 0.64 07/24/2020   GLUCOSE 126 (H) 07/24/2020    Discharge Medications:   Allergies as of 07/25/2020      Reactions   Erythromycin    Dry Heaves   Keflex [cephalexin]    Dry heaves   Oxycodone Hcl    Hallucinations      Medication List    TAKE these medications   aspirin EC 325 MG tablet Take 1 tablet (325 mg total) by mouth 2 (two) times daily.   baclofen 10 MG tablet Commonly known as: LIORESAL Take 1 tablet (10 mg total) by mouth 3 (three) times daily. As needed for muscle spasm   DHEA PO Take 1 tablet by mouth daily.   diphenhydrAMINE 25 MG tablet Commonly known as:  BENADRYL Take 25 mg by mouth every 6 (six) hours as needed for allergies.   HYDROcodone-acetaminophen 10-325 MG tablet Commonly known as: Norco Take 1 tablet by mouth every 6 (six) hours as needed.   HYDROmorphone 2 MG tablet Commonly known as: Dilaudid Take 1 tablet (2 mg total) by mouth every 6 (six) hours as needed (breakthrough pain).   loratadine 10 MG tablet Commonly known as: CLARITIN Take 10 mg by mouth daily as needed for allergies.   losartan-hydrochlorothiazide 100-25 MG tablet Commonly known as: HYZAAR Take 1 tablet by mouth daily.   metFORMIN 500 MG tablet Commonly known as: GLUCOPHAGE Take 500 mg by mouth 2 (two) times daily with a meal.   ondansetron 4 MG tablet Commonly known as: Zofran Take 1 tablet (4 mg total) by mouth every 8 (eight) hours as needed for nausea or vomiting.    sennosides-docusate sodium 8.6-50 MG tablet Commonly known as: SENOKOT-S Take 2 tablets by mouth daily.   sulfamethoxazole-trimethoprim 400-80 MG tablet Commonly known as: BACTRIM Take 1 tablet by mouth daily.   Theratears 0.25 % Soln Generic drug: Carboxymethylcellulose Sodium Place 1 drop into both ears daily as needed (Dry eye).   Vitamin D3 75 MCG (3000 UT) Tabs Take 3,000 Units by mouth daily.   Vitamin D3 250 MCG (10000 UT) Tabs Take 10,000 mcg by mouth daily.       Diagnostic Studies: DG Knee Left Port  Result Date: 07/23/2020 CLINICAL DATA:  Total knee replacement. EXAM: PORTABLE LEFT KNEE - 1-2 VIEW COMPARISON:  No recent. FINDINGS: Total left knee replacement.  Hardware intact.  Anatomic alignment. 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 Electronically Signed   By: Maisie Fus  Register   On: 07/23/2020 13:00    Disposition: Discharge disposition: 06-Home-Health Care Svc          Follow-up Information    Teryl Lucy, MD. Schedule an appointment as soon as possible for a visit in 2 weeks.   Specialty: Orthopedic Surgery Contact information: [address] [place] [address] [phone]                Signed: Annita Brod 07/25/2020, 3:52 PM

## 2020-07-25 NOTE — Progress Notes (Signed)
     Subjective: 2 Days Post-Op s/p Procedure(s): TOTAL KNEE ARTHROPLASTY   Patient is alert, oriented. Patient reports pain as severe earl this morning, but has since significantly improved with norco, robaxin, and mustard.  Denies chest pain, SOB, Calf pain. No nausea/vomiting. No other complaints. Passing flatus but not bowel movement yet.   Objective:  PE: VITALS:   Vitals:   07/24/20 0552 07/24/20 1001 07/24/20 2307 07/25/20 0611  BP: 124/61 (!) 107/44 (!) 160/60 (!) 150/69  Pulse: 66 70 72 74  Resp: 18 18 18 18   Temp: 97.8 F (36.6 C) 97.6 F (36.4 C) 98 F (36.7 C) 98.2 F (36.8 C)  TempSrc: Oral Oral Oral   SpO2: 100% 98% 95% 99%  Weight:        ABD soft Sensation intact distally Intact pulses distally Dorsiflexion/Plantar flexion intact Incision: scant drainage  LABS  Results for orders placed or performed during the hospital encounter of 07/23/20 (from the past 24 hour(s))  CBC     Status: Abnormal   Collection Time: 07/25/20  3:30 AM  Result Value Ref Range   WBC 8.0 4.0 - 10.5 K/uL   RBC 3.44 (L) 3.87 - 5.11 MIL/uL   Hemoglobin 10.5 (L) 12.0 - 15.0 g/dL   HCT 07/27/20 (L) 23.5 - 36.1 %   MCV 94.8 80.0 - 100.0 fL   MCH 30.5 26.0 - 34.0 pg   MCHC 32.2 30.0 - 36.0 g/dL   RDW 44.3 15.4 - 00.8 %   Platelets 212 150 - 400 K/uL   nRBC 0.0 0.0 - 0.2 %    DG Knee Left Port  Result Date: 07/23/2020 CLINICAL DATA:  Total knee replacement. EXAM: PORTABLE LEFT KNEE - 1-2 VIEW COMPARISON:  No recent. FINDINGS: Total left knee replacement.  Hardware intact.  Anatomic alignment. 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 Electronically Signed   By: 07/25/2020  Register   On: 07/23/2020 13:00    Assessment/Plan: Active Problems:   Osteoarthritis of left knee   S/P total knee arthroplasty, left   Inadequate pain control  2 Days Post-Op s/p Procedure(s): TOTAL KNEE ARTHROPLASTY Weightbearing: WBAT LLE Insicional and dressing care: Dressings  left intact until follow-up VTE prophylaxis: Aspirin 325mg  BID x 30 days Pain control: changed IV dilaudid to po dilaudid to wean off IV meds Follow - up plan: 2 weeks with Dr. 07/25/2020 Dispo:  - improving today, plan to work with PT twice today on ambulating further and stair training, patient wants to go home today, will plan to discharge home if/when she passes PT  Contact information:   Weekdays 8-5 Dion Saucier, PA-C [phone] A fter hours and holidays please check [email or web address] for group call information for [facility]  Janine Ores 07/25/2020, 7:09 AM

## 2020-07-25 NOTE — Progress Notes (Signed)
Physical Therapy Treatment Patient Details Name: Lori Navarro MRN: [id number] DOB: 07/25/1945 Today's Date: 07/25/2020    History of Present Illness Patient is 75 y.o. 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    PT Comments    Pt continues to make gradual progress but limited by pain.  She was able to demonstrate safe transfers, gait, and stairs with assist.  Pt will have necessary support at home and now has DME.  Will continue to benefit from PT to advance while hospitalized but does demonstrate mobility necessary to return home with family from PT perspective.    Follow Up Recommendations  Follow surgeon's recommendation for DC plan and follow-up therapies;Home health PT     Equipment Recommendations  Rolling walker with 5" wheels;3in1 (PT)    Recommendations for Other Services       Precautions / Restrictions Precautions Precautions: Fall Restrictions Other Position/Activity Restrictions: WBAT    Mobility  Bed Mobility               General bed mobility comments: in chair at arrival    Transfers Overall transfer level: Needs assistance Equipment used: Rolling walker (2 wheeled) Transfers: Sit to/from Stand Sit to Stand: Min assist         General transfer comment: Pt recalled cues for hand placement, increased time to rise, assist with L LE to sit  Ambulation/Gait Ambulation/Gait assistance: Min guard Gait Distance (Feet): 50 Feet Assistive device: Rolling walker (2 wheeled) Gait Pattern/deviations: Step-to pattern;Decreased stance time - left Gait velocity: decreased   General Gait Details: Step to L gait but was steady; min cues for posture and to roll RW   Stairs Stairs: Yes Stairs assistance: Min assist Stair Management: One rail Right;Step to pattern;Forwards Number of Stairs: 5 General stair comments: Rail on R and HHA on L going up - reverse down.  Spouse present and educated on how to assit  with HHA. Pt demonstrated correct sequencing   Wheelchair Mobility    Modified Rankin (Stroke Patients Only)       Balance Overall balance assessment: Needs assistance Sitting-balance support: Feet supported Sitting balance-Leahy Scale: Good     Standing balance support: During functional activity;Bilateral upper extremity supported Standing balance-Leahy Scale: Poor Standing balance comment: requiring UE support                            Cognition Arousal/Alertness: Awake/alert Behavior During Therapy: WFL for tasks assessed/performed Overall Cognitive Status: Within Functional Limits for tasks assessed                                 General Comments: Reports pain better than yesterday; hopeful to go home today      Exercises Total Joint Exercises Ankle Circles/Pumps: AROM;Both;10 reps;Supine Quad Sets: AROM;Both;10 reps;Supine Towel Squeeze: AROM;Both;10 reps;Supine Heel Slides: AAROM;Left;5 reps;Supine Hip ABduction/ADduction: AAROM;Left;5 reps;Supine Goniometric ROM: L knee10 to 40 degrees limited by pain    General Comments   Educated on safe ice use, no pivots, car transfers, resting with leg straight, and TED hose during day. Also, encouraged walking every 1-2 hours during day. Educated on HEP with focus on mobility the first weeks. Discussed doing exercises within pain control and if pain increasing could decreased ROM, reps, and stop exercises as needed. Encouraged to perform quad sets and ankle pumps frequently for blood  flow and to promote full knee extension.      Pertinent Vitals/Pain Pain Assessment: 0-10 Pain Score: 6  Pain Location: Rt knee Pain Descriptors / Indicators: Discomfort;Sharp Pain Intervention(s): Limited activity within patient's tolerance;Monitored during session;Premedicated before session;Repositioned;Ice applied    Home Living                      Prior Function            PT Goals (current  goals can now be found in the care plan section) Acute Rehab PT Goals Patient Stated Goal: get back to riding her bike PT Goal Formulation: With patient/family Time For Goal Achievement: 07/30/20 Potential to Achieve Goals: Good Progress towards PT goals: Progressing toward goals    Frequency    7X/week      PT Plan Current plan remains appropriate    Co-evaluation              AM-PAC PT "6 Clicks" Mobility   Outcome Measure  Help needed turning from your back to your side while in a flat bed without using bedrails?: A Little Help needed moving from lying on your back to sitting on the side of a flat bed without using bedrails?: A Little Help needed moving to and from a bed to a chair (including a wheelchair)?: A Little Help needed standing up from a chair using your arms (e.g., wheelchair or bedside chair)?: A Little Help needed to walk in hospital room?: A Little Help needed climbing 3-5 steps with a railing? : A Little 6 Click Score: 18    End of Session Equipment Utilized During Treatment: Gait belt Activity Tolerance: Patient limited by pain Patient left: in chair;with call bell/phone within reach Nurse Communication: Mobility status PT Visit Diagnosis: Muscle weakness (generalized) (M62.81);Difficulty in walking, not elsewhere classified (R26.2);Pain Pain - Right/Left: Left Pain - part of body: Knee     Time: 9741-6384 PT Time Calculation (min) (ACUTE ONLY): 27 min  Charges:  $Gait Training: 8-22 mins $Therapeutic Exercise: 8-22 mins $Therapeutic Activity: 8-22 mins                     Anise Salvo, PT Acute Rehab Services Pager [phone] [facility] [phone]     Rayetta Humphrey 07/25/2020, 2:33 PM

## 2020-07-25 NOTE — Progress Notes (Signed)
Patient discharged to home w/ family. Given all belongings, instructions. Verbalized understanding of instructions. Escorted to pov via w/c. 

## 2020-07-25 NOTE — Progress Notes (Signed)
Physical Therapy Treatment Patient Details Name: Lori Navarro MRN: [id number] DOB: [birth year] Today's Date: 07/25/2020    History of Present Illness Patient is 75 y.o. 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    PT Comments    Pt is making gradual progress today.  She reports her pain is better controlled.  She was able to ambulate 9' with RW and performed stairs.  Pt with limited knee ROM due to pain.  Continue to progress with therapy. Pt hopeful to go home this afternoon.     Follow Up Recommendations  Follow surgeon's recommendation for DC plan and follow-up therapies;Home health PT     Equipment Recommendations  Rolling walker with 5" wheels;3in1 (PT)    Recommendations for Other Services       Precautions / Restrictions Precautions Precautions: Fall Restrictions Weight Bearing Restrictions: No Other Position/Activity Restrictions: WBAT    Mobility  Bed Mobility               General bed mobility comments: in chair at arrival    Transfers Overall transfer level: Needs assistance Equipment used: Rolling walker (2 wheeled) Transfers: Sit to/from Stand Sit to Stand: Min assist         General transfer comment: Pt recalled cues for hand placement; cued for L LE managment for pain control and required assist with sitting to lift L leg; performed sit to stand x 3 throughout session  Ambulation/Gait Ambulation/Gait assistance: Min guard Gait Distance (Feet): 50 Feet (50' then 25') Assistive device: Rolling walker (2 wheeled) Gait Pattern/deviations: Step-to pattern;Decreased stance time - left Gait velocity: decreased   General Gait Details: Step to L gait but was steady; min cues for RW proximity   Stairs Stairs: Yes Stairs assistance: Min guard Stair Management: Two rails;One rail Right;Step to pattern;Forwards Number of Stairs: 5 General stair comments: Started with 2 rails and progressed to 1 rail  on R and HHA on L to mimic home set up.  Used 4" steps as pt reports hers are low in height.  Cued for sequencing and she was able to perform.  Also tried cane instead of HHA but was steadier with HHA and has her son to help at home.   Wheelchair Mobility    Modified Rankin (Stroke Patients Only)       Balance Overall balance assessment: Needs assistance Sitting-balance support: Feet supported Sitting balance-Leahy Scale: Good     Standing balance support: During functional activity;Bilateral upper extremity supported Standing balance-Leahy Scale: Poor Standing balance comment: requiring UE support                            Cognition Arousal/Alertness: Awake/alert Behavior During Therapy: WFL for tasks assessed/performed Overall Cognitive Status: Within Functional Limits for tasks assessed                                 General Comments: Reports pain better than yesterday; hopeful to go home today      Exercises Total Joint Exercises Ankle Circles/Pumps: AROM;Both;10 reps;Supine Quad Sets: AROM;Both;10 reps;Supine Towel Squeeze: AROM;Both;10 reps;Supine Heel Slides: AAROM;Left;5 reps;Supine Hip ABduction/ADduction: AAROM;Left;5 reps;Supine Long Arc Quad: AAROM;5 reps;Left;Seated Knee Flexion: AAROM;Left;5 reps;Seated Goniometric ROM: L knee10 to 40 degrees limited by pain    General Comments   Educated on safe ice use, no pivots, car transfers, resting with leg  straight, and TED hose during day. Also, encouraged walking every 1-2 hours during day. Educated on HEP with focus on mobility the first weeks. Discussed doing exercises within pain control and if pain increasing could decreased ROM, reps, and stop exercises as needed. Encouraged to perform quad sets and ankle pumps frequently for blood flow and to promote full knee extension.      Pertinent Vitals/Pain Pain Assessment: 0-10 Pain Score: 6  Pain Location: Rt knee Pain Descriptors /  Indicators: Discomfort;Sharp Pain Intervention(s): Limited activity within patient's tolerance;Monitored during session;Premedicated before session;Repositioned;Ice applied    Home Living                      Prior Function            PT Goals (current goals can now be found in the care plan section) Acute Rehab PT Goals Patient Stated Goal: get back to riding her bike PT Goal Formulation: With patient/family Time For Goal Achievement: 07/30/20 Potential to Achieve Goals: Good Progress towards PT goals: Progressing toward goals    Frequency    7X/week      PT Plan Current plan remains appropriate    Co-evaluation              AM-PAC PT "6 Clicks" Mobility   Outcome Measure  Help needed turning from your back to your side while in a flat bed without using bedrails?: A Little Help needed moving from lying on your back to sitting on the side of a flat bed without using bedrails?: A Little Help needed moving to and from a bed to a chair (including a wheelchair)?: A Little Help needed standing up from a chair using your arms (e.g., wheelchair or bedside chair)?: A Little Help needed to walk in hospital room?: A Little Help needed climbing 3-5 steps with a railing? : A Little 6 Click Score: 18    End of Session Equipment Utilized During Treatment: Gait belt Activity Tolerance: Patient limited by pain (limited by pain but improving) Patient left: in chair;with call bell/phone within reach;with chair alarm set Nurse Communication: Mobility status PT Visit Diagnosis: Muscle weakness (generalized) (M62.81);Difficulty in walking, not elsewhere classified (R26.2);Pain Pain - Right/Left: Left Pain - part of body: Knee     Time: 2229-7989 PT Time Calculation (min) (ACUTE ONLY): 45 min  Charges:  $Gait Training: 8-22 mins $Therapeutic Exercise: 8-22 mins $Therapeutic Activity: 8-22 mins                     Anise Salvo, PT Acute Rehab Services Pager  [phone] [facility] [phone]     Rayetta Humphrey 07/25/2020, 12:24 PM

## 2020-08-16 ENCOUNTER — Other Ambulatory Visit (HOSPITAL_COMMUNITY): Payer: Self-pay | Admitting: Orthopedic Surgery

## 2020-08-16 ENCOUNTER — Ambulatory Visit (HOSPITAL_COMMUNITY): Admission: RE | Admit: 2020-08-16 | Payer: Medicare Other | Source: Ambulatory Visit

## 2020-08-16 DIAGNOSIS — M79605 Pain in left leg: Secondary | ICD-10-CM

## 2022-01-12 IMAGING — DX DG KNEE 1-2V PORT*L*
2 series · 2 of 2 positions shown · non-contrast
Comparison: No recent.

CLINICAL DATA: Total knee replacement.

EXAM:
PORTABLE LEFT KNEE - 1-2 VIEW

[knee ap]
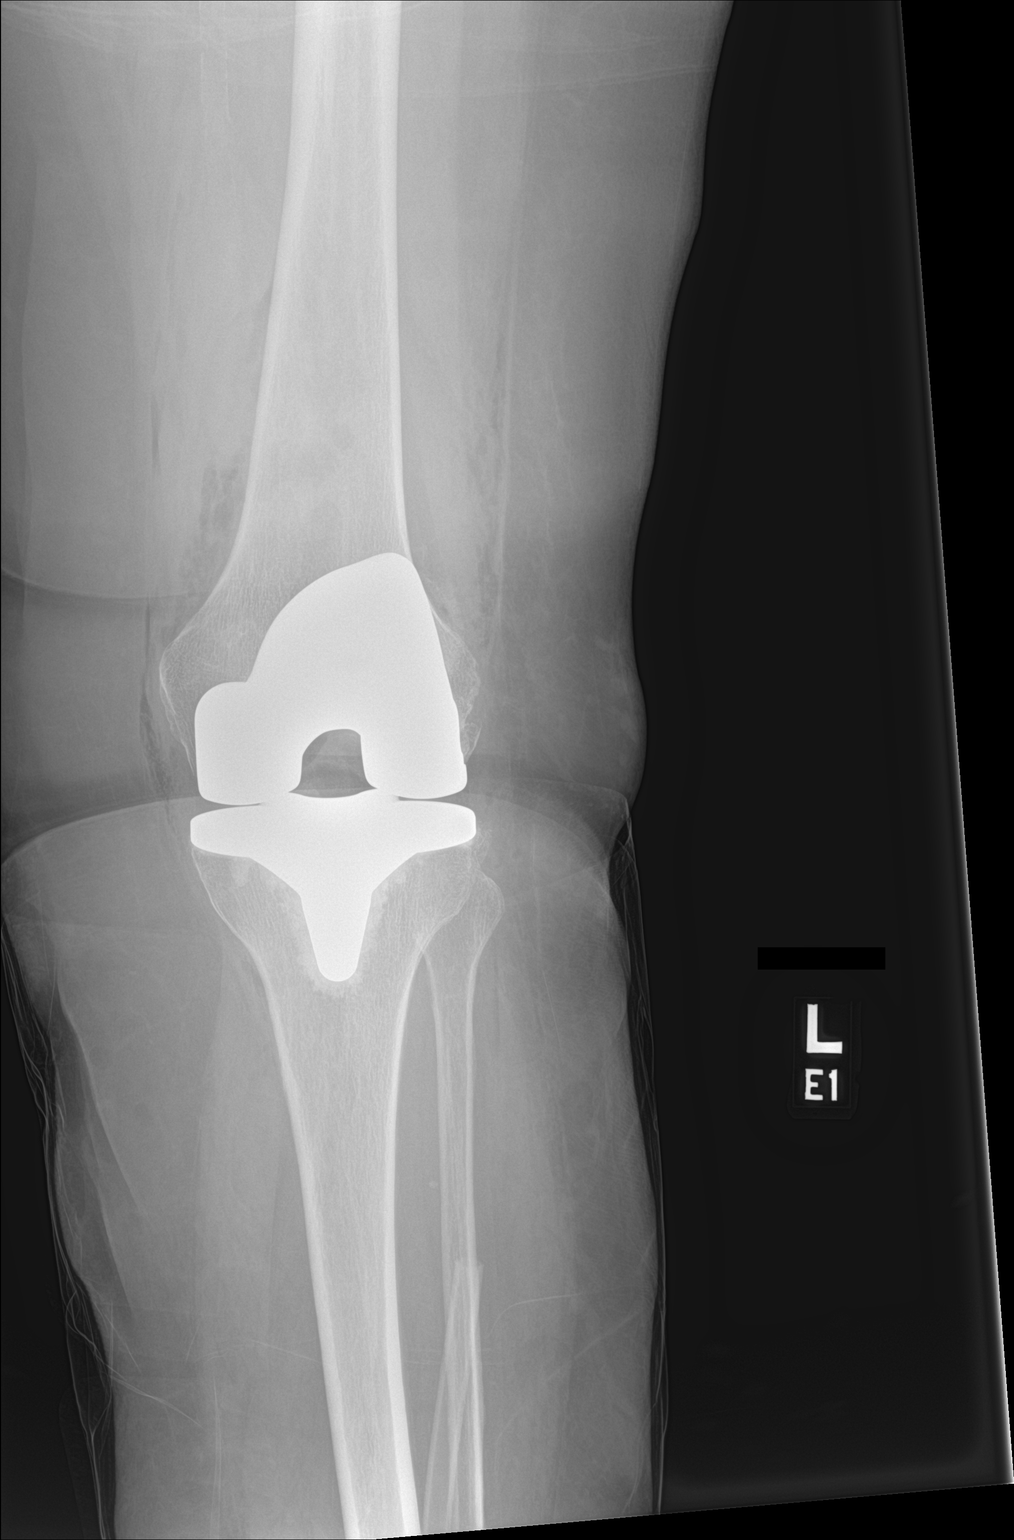

[knee lat]
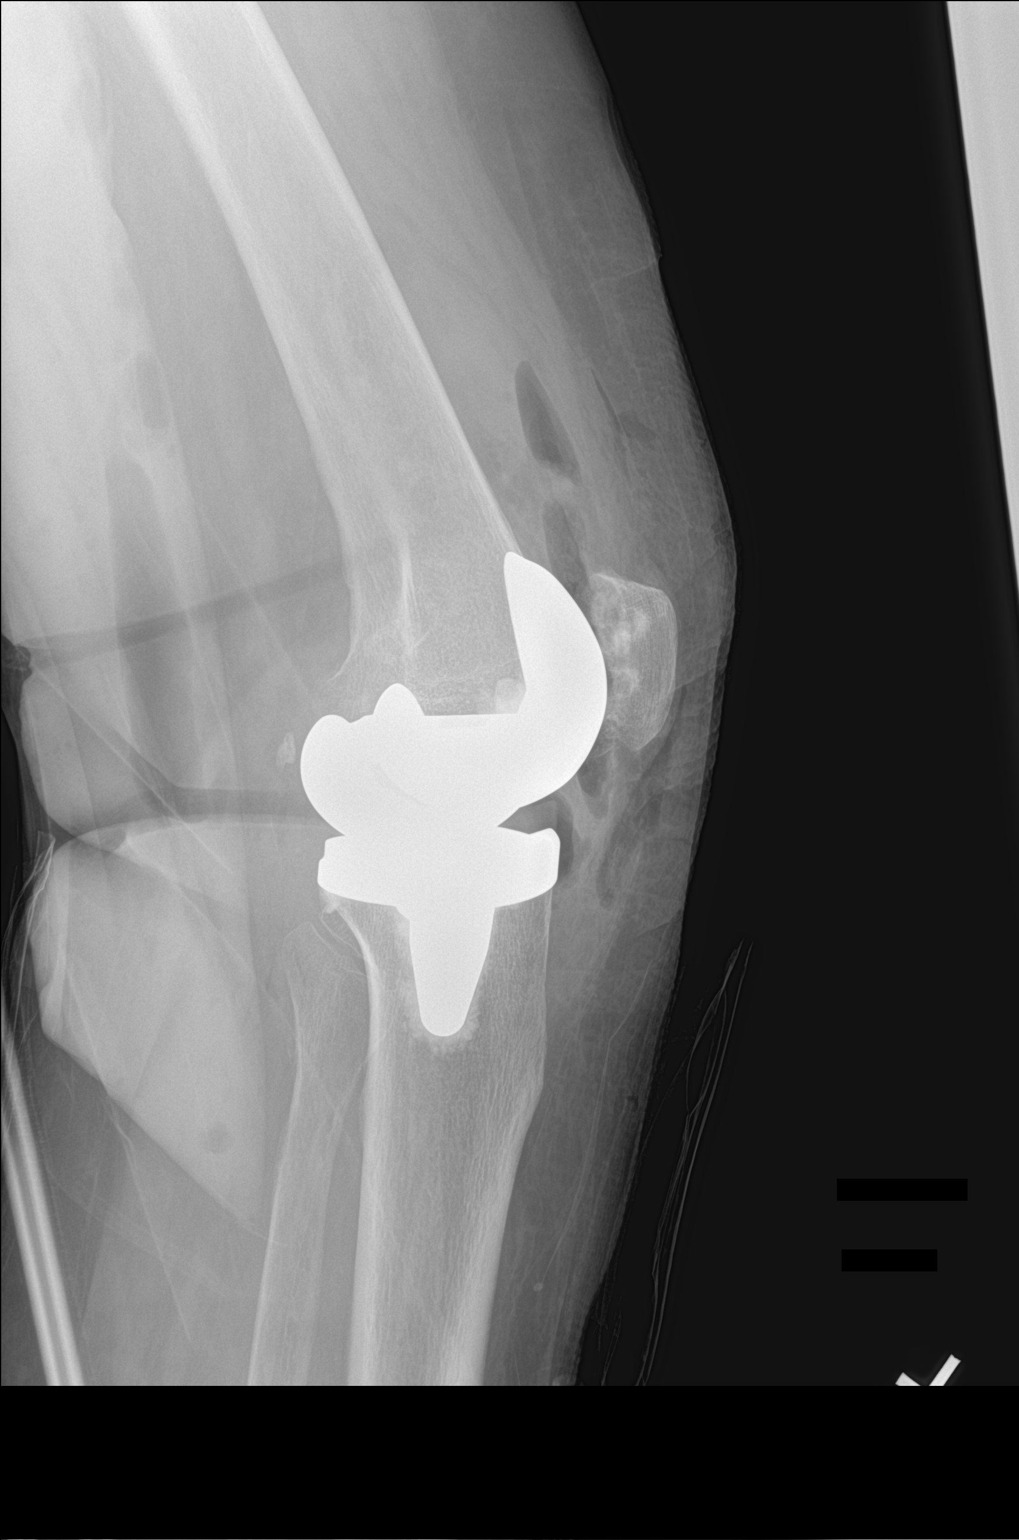

[2 of 2 positions shown; findings below may reference images not displayed]

FINDINGS: Total left knee replacement.  Hardware intact.  Anatomic alignment.
IMPRESSION: Total left knee replacement with anatomic alignment.
